# Patient Record
Sex: Female | Born: 1990 | Race: Black or African American | Hispanic: No | Marital: Single | Smoking: Current some day smoker
Health system: Southern US, Community
[De-identification: ages and names within clinical notes are randomized; demographics above are authoritative.]

## PROBLEM LIST (undated history)

## (undated) DIAGNOSIS — F329 Major depressive disorder, single episode, unspecified: Secondary | ICD-10-CM

## (undated) DIAGNOSIS — K259 Gastric ulcer, unspecified as acute or chronic, without hemorrhage or perforation: Secondary | ICD-10-CM

## (undated) DIAGNOSIS — K219 Gastro-esophageal reflux disease without esophagitis: Secondary | ICD-10-CM

## (undated) DIAGNOSIS — D649 Anemia, unspecified: Secondary | ICD-10-CM

## (undated) DIAGNOSIS — R87629 Unspecified abnormal cytological findings in specimens from vagina: Secondary | ICD-10-CM

## (undated) DIAGNOSIS — F419 Anxiety disorder, unspecified: Secondary | ICD-10-CM

## (undated) DIAGNOSIS — F909 Attention-deficit hyperactivity disorder, unspecified type: Secondary | ICD-10-CM

## (undated) DIAGNOSIS — F32A Depression, unspecified: Secondary | ICD-10-CM

## (undated) HISTORY — DX: Unspecified abnormal cytological findings in specimens from vagina: R87.629

## (undated) HISTORY — PX: LEEP: SHX91

## (undated) HISTORY — PX: ELBOW SURGERY: SHX618

## (undated) HISTORY — DX: Depression, unspecified: F32.A

## (undated) HISTORY — PX: TONSILLECTOMY: SUR1361

## (undated) HISTORY — DX: Major depressive disorder, single episode, unspecified: F32.9

## (undated) HISTORY — DX: Gastric ulcer, unspecified as acute or chronic, without hemorrhage or perforation: K25.9

## (undated) HISTORY — DX: Anemia, unspecified: D64.9

## (undated) HISTORY — DX: Attention-deficit hyperactivity disorder, unspecified type: F90.9

---
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
## 2010-09-03 ENCOUNTER — Emergency Department: Payer: Self-pay | Admitting: Emergency Medicine

## 2011-03-18 ENCOUNTER — Ambulatory Visit: Payer: Self-pay | Admitting: Internal Medicine

## 2011-07-08 ENCOUNTER — Ambulatory Visit: Payer: Self-pay | Admitting: Internal Medicine

## 2011-08-01 ENCOUNTER — Inpatient Hospital Stay: Payer: Self-pay | Admitting: Internal Medicine

## 2011-08-01 LAB — CBC
HGB: 4.5 g/dL — CL (ref 12.0–16.0)
MCH: 20.8 pg — ABNORMAL LOW (ref 26.0–34.0)
MCHC: 30.4 g/dL — ABNORMAL LOW (ref 32.0–36.0)
MCV: 68 fL — ABNORMAL LOW (ref 80–100)
WBC: 7.9 10*3/uL (ref 3.6–11.0)

## 2011-08-01 LAB — COMPREHENSIVE METABOLIC PANEL
Albumin: 4.2 g/dL (ref 3.4–5.0)
Anion Gap: 12 (ref 7–16)
BUN: 17 mg/dL (ref 7–18)
Bilirubin,Total: 0.3 mg/dL (ref 0.2–1.0)
Creatinine: 0.77 mg/dL (ref 0.60–1.30)
EGFR (Non-African Amer.): >60
Glucose: 86 mg/dL (ref 65–99)
Potassium: 3.3 mmol/L — ABNORMAL LOW (ref 3.5–5.1)
SGOT(AST): 16 U/L (ref 15–37)
SGPT (ALT): 17 U/L

## 2011-08-01 LAB — PREGNANCY, URINE: Pregnancy Test, Urine: NEGATIVE m[IU]/mL

## 2011-08-01 LAB — URINALYSIS, COMPLETE: Protein: NEGATIVE

## 2011-08-01 LAB — FERRITIN: Ferritin (ARMC): 2 ng/mL — ABNORMAL LOW (ref 8–388)

## 2011-08-02 LAB — BASIC METABOLIC PANEL
Calcium, Total: 8.2 mg/dL — ABNORMAL LOW (ref 8.5–10.1)
Chloride: 111 mmol/L — ABNORMAL HIGH (ref 98–107)
Co2: 20 mmol/L — ABNORMAL LOW (ref 21–32)
Glucose: 71 mg/dL (ref 65–99)
Potassium: 3.1 mmol/L — ABNORMAL LOW (ref 3.5–5.1)
Sodium: 145 mmol/L (ref 136–145)

## 2011-08-02 LAB — CBC WITH DIFFERENTIAL/PLATELET
Basophil #: 0 10*3/uL (ref 0.0–0.1)
HCT: 13.6 % — CL (ref 35.0–47.0)
HGB: 4.1 g/dL — CL (ref 12.0–16.0)
Lymphocyte #: 3.6 10*3/uL (ref 1.0–3.6)
MCHC: 30.1 g/dL — ABNORMAL LOW (ref 32.0–36.0)
Monocyte #: 0.2 10*3/uL (ref 0.0–0.7)
Monocyte %: 3.5 %
Neutrophil #: 3 10*3/uL (ref 1.4–6.5)
Platelet: 378 10*3/uL (ref 150–440)
WBC: 6.9 10*3/uL (ref 3.6–11.0)

## 2011-08-02 LAB — HEMOGLOBIN: HGB: 4.2 g/dL — CL (ref 12.0–16.0)

## 2011-08-02 LAB — IRON AND TIBC: Iron: 6 ug/dL — ABNORMAL LOW (ref 50–170)

## 2011-08-03 LAB — CBC WITH DIFFERENTIAL/PLATELET
Basophil #: 0 10*3/uL (ref 0.0–0.1)
Basophil %: 0.3 %
Eosinophil #: 0 10*3/uL (ref 0.0–0.7)
HCT: 13.5 % — CL (ref 35.0–47.0)
Lymphocyte %: 32 %
MCH: 20.2 pg — ABNORMAL LOW (ref 26.0–34.0)
MCV: 68 fL — ABNORMAL LOW (ref 80–100)
Platelet: 413 10*3/uL (ref 150–440)
RDW: 19.9 % — ABNORMAL HIGH (ref 11.5–14.5)

## 2011-08-03 LAB — MAGNESIUM: Magnesium: 1.9 mg/dL

## 2011-08-03 LAB — FOLATE: Folic Acid: 13.5 ng/mL (ref 3.1–100.0)

## 2011-08-03 LAB — POTASSIUM: Potassium: 3.8 mmol/L (ref 3.5–5.1)

## 2011-08-04 LAB — URINALYSIS, COMPLETE
Bacteria: NONE SEEN
Bilirubin,UR: NEGATIVE
Blood: NEGATIVE
Glucose,UR: NEGATIVE mg/dL (ref 0–75)
Ketone: NEGATIVE
Leukocyte Esterase: NEGATIVE
Nitrite: NEGATIVE
RBC,UR: <1 /HPF (ref 0–5)
Specific Gravity: 1.008 (ref 1.003–1.030)
Squamous Epithelial: 1
WBC UR: 1 /HPF (ref 0–5)

## 2011-08-04 LAB — HEMOGLOBIN: HGB: 4.9 g/dL — CL (ref 12.0–16.0)

## 2011-08-08 ENCOUNTER — Ambulatory Visit: Payer: Self-pay | Admitting: Internal Medicine

## 2013-03-06 DIAGNOSIS — D069 Carcinoma in situ of cervix, unspecified: Secondary | ICD-10-CM | POA: Insufficient documentation

## 2014-07-08 ENCOUNTER — Emergency Department: Payer: Self-pay | Admitting: Student

## 2014-08-31 NOTE — Consult Note (Signed)
PATIENT NAME:  Sherri [name], Sherri [name] MR#:  [id number] DATE OF BIRTH:  October 10, 1990  DATE OF CONSULTATION:  08/03/2011  REFERRING PHYSICIAN:  Katharina Caper, MD  CONSULTING PHYSICIAN:  Lurline Del, MD  PRIMARY CARE PHYSICIAN: Angus Palms, MD  REASON FOR CONSULTATION: Severe anemia and melena.   HISTORY OF PRESENT ILLNESS: This is a 24 year old African American female with history of stomach ulcer with bleeding about three years ago, also history of attention deficit/hyperactivity disorder, depression, and bipolar disorder. The patient was admitted to the hospital the day before yesterday with nausea and vomiting. Apparently she has been having trouble with nausea and vomiting for almost a month. Recently she has noticed the vomitus to be dark and coffee-ground in nature. She is also complaining of some burning upper abdominal pain. Apparently she had an EGD about 2 or 3 years ago where peptic ulcer disease was found. She is also noticing somewhat dark stool for the last couple of days. She denies any bright red blood per rectum. Her hemoglobin was noted to be 4.5. Her MCV is low at around 68. The patient apparently has been having some trouble with heavy menstrual periods as well that started two days ago. The patient is a Scientist, product/process development. Transfusion was recommended but has been refused by the patient. Her hemoglobin is now 4.1 and this now seems to be stable at 4.1 for about 24 hours or so.   PAST MEDICAL HISTORY: As above.   HOME MEDICATIONS: Remeron, Excedrin, and Intuniv.   PAST SURGICAL HISTORY:  1. Tonsillectomy. 2. EGD as mentioned above.   ALLERGIES: Penicillin.   FAMILY HISTORY: Positive for hypertension and diabetes.   SOCIAL HISTORY: She is single. She smokes two cigarettes a day. She drinks alcohol only occasionally.   REVIEW OF SYSTEMS: Positive for fatigue, weakness, chills, and about 25 pound weight loss, as well as dark stools and coffee-ground emesis. The patient is also  complaining of some left upper quadrant pain. She denies significant chest pain or shortness of breath on rest. The patient is complaining of a heavy menstrual period that started a few days ago.   PHYSICAL EXAMINATION:   GENERAL: Fairly well built female who does not appear to be in any acute distress. She appears to be hemodynamically fairly stable.  VITALS:  Her heart rate is in the 70s and 80s and blood pressure is about 110/50. Clinically she does appear to be severely anemic.   NECK: Neck veins are flat.   LUNGS: Clear to auscultation.   HEART: Regular rate and rhythm. No gallops or murmur.   ABDOMEN: Quite soft and benign. Bowel sounds are positive and regular. No guarding or rebound was noted. No hepatosplenomegaly or ascites.   NEUROLOGIC: She is fully awake, alert, and oriented. Neurological examination appears to be unremarkable.  LABS/STUDIES: Her white cell count is 7.8, hemoglobin is 4.1, hematocrit 13.5, platelet count of 413, and MCV 68. Potassium is somewhat low at 3.1. Iron saturation is only 1%. BUN 17 and creatinine 0.77. Liver enzymes are normal. Pregnancy test is negative.   Urinalysis is unremarkable.   Ultrasound of the pelvis is quite unremarkable.   ASSESSMENT AND PLAN: The patient is with what appears to be chronic anemia suggested by very low iron saturation and very low MCV. There may have been a recent drop in her hemoglobin and hematocrit secondary to what appears to be upper GI bleed, as reported by the patient. Since she has been in the hospital, there have been no  episodes of melena or coffee-ground emesis and her hemoglobin and hematocrit, even though very low, seems to be fairly stable at around 4.1. In 2010 her hemoglobin was reported to be 11.4. We do not have any other hemoglobin or hematocrit measurements. The patient has been recommended blood transfusion followed by an upper GI endoscopy. 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 I have had a detailed discussion with her regarding the risk of performing an upper GI endoscopy with a hemoglobin of 4.1. Any further bleeding can cause significant harm to her because of her very low baseline hemoglobin. The patient and her mother completely understand the grave consequences associated with not accepting blood transfusion, especially since there are concerns about recent upper GI bleed. Despite their understanding, the patient continues to refuse to accept any blood transfusion. The patient has been started on IV proton pump inhibitor. I will discuss with the anesthesiologist regarding the safety of performing an upper GI endoscopy in a severely anemic individual. I believe that an endoscopy with sedation is not going to be without significant risk to her and in case a bleeding ulcer is found which requires therapeutic endoscopy that may induce further bleeding and further drop in her hemoglobin and hematocrit which could then result in catastrophic consequences. After discussion with the anesthesiologist, we will make further recommendations, although I would continue to recommend blood transfusion because that would be the most appropriate way to take care of this rather complicated situation. Meanwhile we will continue her on a clear liquid diet as well as IV proton pump inhibitor. As mentioned above, there are no signs of active gastrointestinal blood loss and we will return with further recommendations.  ____________________________ Lurline DelShaukat Monea Pesantez, MD si:slb D: 08/03/2011 09:05:07 ET T: 08/03/2011 10:17:50 ET JOB#: [id number]  cc: Lurline DelShaukat Jebediah Macrae, MD, <Dictator> Lurline DelSHAUKAT Turhan Chill MD ELECTRONICALLY SIGNED 08/10/2011 9:56

## 2014-08-31 NOTE — Consult Note (Signed)
Brief Consult Note: Diagnosis: GI bleed and anemia.   Patient was seen by consultant.   Orders entered.   Comments: Probable UGI bleed with severe anemia. The care is complicated by the fact that patient is refusing blood transfusion and anesthesia and endoscopy, especially therapeutic EGD may not be possible with this low hemoglobin without significant risk to her. This has been discussed with her and her mother but the patient will not accept blood transfusion. I will discuss the case with anesthesia as well. No signs of active bleeding today. Will start clear liquids and make her NPO after midnight. Further recommendations after discussion with anesthesia.  Electronic Signatures: Lurline DelIftikhar, Sophi Calligan (MD)  (Signed 26-Mar-13 14:20)  Authored: Brief Consult Note   Last Updated: 26-Mar-13 14:20 by Lurline DelIftikhar, Karel Mowers (MD)

## 2014-08-31 NOTE — Consult Note (Signed)
Chief Complaint:   Subjective/Chief Complaint Feels well. No complaints. Hemoglobin better at 4.9.  Impression: Clean based duodenal ulcer. No signs of active bleeding. H and H stable.  Recommendations: Agree with discharge on PPI. Avoid NSAID's. Will sign off. Thanks.   Electronic Signatures: Lurline DelIftikhar, Gadge Hermiz (MD)  (Signed 28-Mar-13 19:06)  Authored: Chief Complaint   Last Updated: 28-Mar-13 19:06 by Lurline DelIftikhar, Edwyna Dangerfield (MD)

## 2014-08-31 NOTE — Discharge Summary (Signed)
PATIENT NAME:  Sherri [name], Sherri [name] A MR#:  [id number] DATE OF BIRTH:  [birth year]  DATE OF ADMISSION:  08/01/2011 DATE OF DISCHARGE:  08/04/2011  ADMITTING DIAGNOSIS: Severe acute anemia.   DISCHARGE DIAGNOSES:  1. Acute on chronic GI bleed. 2. Status post EGD by Dr. Niel Hummer on 08/03/2011 which showed gastritis as well as small nonbleeding duodenal ulcer.  3. Gastrointestinal bleed, menstrual bleed. 4. Hypokalemia. 5. Hypomagnesemia. 6. Hypotension, transient. 7. History of  depression.  8. History of attention deficit/hyperactivity disorder.  9. Jehovah's Witness.  10. Episode of urinary incontinence as well as diarrhea on the 28th of 07/2011, resolved.   DISCHARGE CONDITION: Stable.   DISCHARGE MEDICATIONS: The patient is to resume her outpatient medications which are:  1. Intuniv 2 mg once daily.  2. Remeron 30 mg p.o. at bedtime.  3. Iron sulfate 330 mg twice daily. 4. Omeprazole 40 mg p.o. twice daily.   DIET: Soft. Advance to regular over the next one week.   PHYSICAL ACTIVITY LIMITATIONS: As tolerated.    FOLLOWUP:  1. Follow-up appointment with the [facility] on 08/16/2011 at 7:15 p.m.  2. Follow up with Dr. Tiburcio Pea of GYN in the next one week after discharge.  Outpatient followup with Dr. Tiburcio Pea should be arranged by the [facility] because of late discharge.   CONSULTANTS:  Dr. Niel Hummer.   RADIOLOGIC STUDIES: Ultrasound of the pelvis 26th of 07/2011, revealed unremarkable transabdominal pelvic ultrasound.  HISTORY OF PRESENT ILLNESS:  The patient is a 24 year old African American female with past medical history significant for history of peptic ulcer disease who presented to the hospital with complaints of nausea, vomiting, and hematemesis.  Please refer to Dr. Arlys John admission note on the 25th of 07/2011. Apparently she started having nausea and vomiting two days prior to coming to the hospital and on the day of admission the patient's vomitus became dark,  bloody, coffee-ground emesis. She was also complaining of abdominal pain, mostly in her left upper quadrant, a burning sensation kind of pain. On exam she also had discomfort in the epigastric area. She was very dizzy and short of breath and got tired quickly. Her lab data showed a hemoglobin level of 4.5. She was admitted to the hospital. It was felt that the patient's anemia was very likely acute posthemorrhagic anemia as well as possibly  acute on chronic iron deficiency anemia. The patient's iron studies were performed and this showed a very low ferritin level of two.  The patient's iron saturation was only 1. Potassium level was found to be 3.3, otherwise unremarkable basic metabolic panel. The patient's liver enzymes were unremarkable. The patient's CBC as mentioned above was remarkable for hemoglobin of 4.5, hematocrit of 14.9, and platelet count 428 with very low MCV of 68.    HOSPITAL COURSE: The patient was admitted to the hospital. Consultation with Dr. Niel Hummer was obtained.  1. Acute gastrointestinal bleed: The patient was admitted to the hospital. She was rehydrated and her hemoglobin was checked every few hours. She underwent an upper endoscopy which revealed gastritis as well as a small, nonbleeding duodenal ulcer. She was started on a clear liquid diet after EGD. She did progressively better and did not have any problems on discharge. She was also managed on a proton pump inhibitor, Protonix IV drip. 2. Iron deficiency anemia:  She was continued on Venofer daily. As she had menstrual period, Depo-Provera was also given on the day of admission and her periods tapered off. On the day of  discharge she felt satisfactory.  Her hemoglobin was noted to be improving with a hemoglobin level of 4.9 on the day of discharge, 08/04/2011. The patient is to continue iron supplements and follow up with the [facility]  as an outpatient. 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 It is recommended to follow the patient's hemoglobin levels as an outpatient very closely and very likely will initiate her on birth control pills to decrease blood loss. The patient underwent ultrasound of her pelvic area, which was unremarkable. She was consulted by numerous physicians, as mentioned above Dr. Niel HummerIftikhar, who took her to the operating room for  EGD, but also Dr. Annamarie MajorPaul Harris of GYN, who felt that the patient needs to be tested for von Willebrand's disease, as according to him the most common cause of hemoglobinopathy in young people could be this problem. He [facility] for ongoing management. He also felt that the patient would benefit from Depo-Provera every three months or change to birth control pills  to control her periods. The patient was also consulted by Dr. Sherrlyn HockPandit, who agreed that the patient would benefit from iron IV. However, he felt that Epogen would not be appropriate in this situation because her iron stores were not adequate.  Vitamin B12 and folic acid levels were checked and those were completely within normal limits.  In regard to Von Willebrand's factor,  unfortunately those studies are not back yet at the time of dictation. Specimen was received at the performing department on 08/02/2011.  3. Depression: The patient is to continue Remeron. 4. Attention deficit/hyperactivity disorder: The patient is to continue Intuniv.  5. The patient was noted to be hypokalemic as well as hypomagnesemic. Those microelements were supplemented for her while she was in the hospital.  6. She had a short episode of hypotension on the day of discharge, 08/04/2011.  However it was briefly lived.  Blood pressure had gone down to 91/53.  However, approximately half an hour later the patient's blood pressure was much improved. On the day of discharge, the patient's vital signs were stable with  temperature 98.1, pulse 98, respiration rate 20, blood pressure 117/66, saturation 100% on room air at rest.  7. The patient was also complaining of an episode of urinary incontinence: Urinalysis was repeated. However, there was no sign of pyuria, which would be suggestive of urinary tract infection. No further recommendations were made in regards to that.  The patient is being discharged in stable condition with the above-mentioned medications and followup.   TIME SPENT: 40 minutes. ____________________________ Katharina Caperima Jaquita Bessire, MD rv:bjt D:  08/04/2011 20:24:47 ET         T: 08/07/2011 13:56:30 ET        JOB#: [id number]   cc: [facility] Rodolphe Edmonston MD ELECTRONICALLY SIGNED 08/10/2011 14:05

## 2014-08-31 NOTE — H&P (Signed)
PATIENT NAME:  Sherri [name], Sherri [name] A MR#:  [id number] DATE OF BIRTH:  [birth year]  DATE OF ADMISSION:  08/01/2011  PRIMARY CARE PHYSICIAN: Angus Palms, MD  HISTORY OF PRESENT ILLNESS: The patient is a 24 year old African American female with past medical history significant for history of stomach ulcer in the past, history of GI bleed in the past, approximately three years ago, history of depression, ADHD, and bipolar disorder who presented to the hospital with complaints of nausea and vomiting. According to the patient, she has been having problems with nausea for approximately a month now. She has however started having vomiting two days ago, on Saturday. She vomited twice today as well as twice on Saturday. On Saturday as well as today she had dark blood in her vomitus, coffee ground emesis. She has been also complaining of abdominal pain mostly in the left upper quadrant, burning kind of pain especially whenever she would eat greasy food. However, she denies any constant pain and denies any significant exacerbation of that discomfort or pain and burning sensation with food. She was diagnosed with peptic ulcer disease in her stomach two to three years ago. She had an EGD done in [place] and was followed by [facility] physicians. She has been also noticing black stool since Saturday. She has been losing weight. She told me that she has lost approximately 25 pounds, in the last couple of weeks. She has also noted chills. She admitted of having problems with lightheadedness, dizziness, and feeling presyncopal, especially whenever she stands up. Because of that discomfort, she presented to the emergency room for further evaluation. In the emergency room, she was noted to have a hemoglobin level of 4.5. 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   PAST MEDICAL HISTORY:  1. History of peptic ulcer disease. 2. Gastrointestinal bleed in the  past. 3. History of depression. 4. Attention deficit/hyperactivity disorder. 5. Bipolar disorder. 6. Left-sided headaches for which she usually takes Excedrin.   MEDICATIONS:  1. Intuniv 2 mg p.o. at bedtime. 2. Remeron 30 mg p.o. at bedtime. 3. Excedrin two caplets up to three times daily for headache.  PAST SURGICAL HISTORY: 1. Tonsillectomy. 2. Left arm surgery for displaced left distal radius as well as ulna fracture, in 2001 by Dr. Deeann Saint.   ALLERGIES: Penicillin - which gives her itching.   FAMILY HISTORY: Hypertension as well as diabetes in the patient's mother. The patient's father died at age of 93 of myocardial infarction.   SOCIAL HISTORY: The patient is single, has no children, used to smoke a little bit, a few cigarettes; however, she quit three weeks ago. She drinks alcohol occasionally socially. She works for [employer], as well as [employer].   REVIEW OF SYSTEMS: Positive for feeling chills, fatigue and weak, pains in her abdomen, and weight loss of 25 pounds since a couple of weeks ago. She feels lightheaded and dizzy as well as presyncopal whenever she stands up. She has also some blurring of vision for which she uses glasses. She uses glasses for nearsightedness. She also has year-around allergies as well as sinus congestion. She gets very tired whenever she walks around and she will get really short of breath. She admits of having chest pain here in the emergency room when she was told about her low hemoglobin levels. Admits of nausea and vomiting for a while now as well as loose stools over the past few days, black looking stools as  well as brown vomitus. Abdominal pain and hematemesis as well as rectal bleeding. CONSTITUTIONAL: She denies any fevers or weight gain. EYES: Denies any double vision, glaucoma, or cataracts. ENT: Denies any tinnitus, allergies, epistaxis, sinus pain, dentures, or difficulty swallowing. RESPIRATORY: Denies any cough,  wheeze, asthma, or chronic obstructive pulmonary disease. CARDIOVASCULAR: Denies any orthopnea, edema, arrhythmias, palpitations, or syncope. GASTROINTESTINAL: Denies any constipation or hemorrhoids. GENITOURINARY: No dysuria, hematuria, frequency, or incontinence. ENDOCRINOLOGY: Denies any polydipsia, nocturia, thyroid problems, heat or cold intolerance, or thirst. HEMATOLOGIC: Denies anemia, easy bruising, bleeding, or swollen glands. SKIN: Denies any acne, rashes, lesions, or change in moles. MUSCULOSKELETAL: Denies arthritis, cramps, swelling, or gout. NEUROLOGIC: No numbness, epilepsy, or tremor. PSYCHIATRIC: No anxiety, insomnia, or depression. GYN: She started her menstrual period Saturday night, which was two days ago. She usually changes 5 or 6 pads a day.    PHYSICAL EXAMINATION:   VITALS: On arrival to the emergency room, her temperature was 98.3, pulse 105, respirations 18, blood pressure 133/66, and saturation 99% on room air.   GENERAL: This is a well developed, well nourished [place] American female in no significant distress comfortable on the stretcher.   HEENT: Her pupils are equal and reactive to light. Extraocular movements are intact. No icterus or conjunctivitis. Has normal hearing. No pharyngeal erythema. Mucosa is moist.   NECK: No masses, supple and nontender. Thyroid is not enlarged. No adenopathy. No JVD or carotid bruits bilaterally. Full range of motion.   LUNGS: Clear to auscultation in all fields. No rales or rhonchi or crepitations. No labored inspirations, increased effort, dullness to percussion, or overt respiratory distress.   CARDIOVASCULAR: S1 and S2 appreciated. No murmurs, gallops, or rubs noted. PMI not lateralized. Chest is nontender to palpation.   EXTREMITIES: 1+ pedal pulses. No lower extremity edema, calf tenderness, or cyanosis.   ABDOMEN: Soft, tender in the epigastric area as well as right upper quadrant. No rebound or guarding was noted. No  significant discomfort in the left upper quadrant. No hepatosplenomegaly. Bowel sounds are present.   RECTAL: Deferred.   MUSCULOSKELETAL: Able to move all extremities. No cyanosis, degenerative joint disease, or kyphosis. Gait is not tested.   SKIN: Skin did not reveal any rashes, lesions, erythema, nodularity, or induration. It was warm and dry to palpation.   LYMPH: No adenopathy in the cervical region.   NEUROLOGICAL: Cranial nerves grossly intact. Sensory is intact. No dysarthria or aphasia. The patient is alert and oriented to time, person, and place. Cooperative. Memory is good.   PSYCHIATRIC: No significant confusion, agitation, or depression noted.   LABS/STUDIES: BMP showed potassium of 3.3. Magnesium 1.7. Iron binding capacity was 492 and ferritin level was low at 2. Liver enzymes are normal. CBC: White blood cell count 7.9, hemoglobin 4.5, platelets 428, and MCV low at 68.   Urinalysis: Straw hazy urine. Negative for glucose, bilirubin or ketones. Specific gravity 1.020, pH 5.0, 2+ blood, negative for protein, nitrites, or leukocyte esterase, less than 1 red blood cell, 4 white blood cells, trace bacteria,  and 2 epithelial cells as well as mucus was present.   Pregnancy test in urine was negative.   Radiologic studies and EKG were not done.   ASSESSMENT AND PLAN:  1. Acute posthemorrhagic anemia as well as likely chronic iron deficiency anemia: Admit the patient to the medical floor. Unfortunately, we will not be able to transfuse her. We will get hemoglobin levels every eight hours. We will also offer her Venofer IV today as  well as daily for five days. I will also give her one dose of Depo-Provera 150 mg IM to taper off her menstrual bleeding.  2. Gastrointestinal bleed: We will continue PPI drip. We will ask gastroenterologist to see the patient. I discussed with this patient quite extensively her use of nonsteroidal anti-inflammatory medications and suggested that she would  need to talk to her primary care physician to evaluate her possible migraine headaches and initiate her on migraine headache medications as needed.  3. Depression: Continue Remeron.  4. Attention deficit/hyperactivity disorder: Continue Intuniv.   TIME SPENT: One hour.  ____________________________ Katharina Caperima Para Cossey, MD rv:slb D: 08/01/2011 23:25:03 ET     T: 08/02/2011 09:37:12 ET        JOB#: [id number] cc: Katharina Caperima Thadd Apuzzo, MD, <Dictator> Angus PalmsSionne George, MD Yuliya Nova MD ELECTRONICALLY SIGNED 08/10/2011 14:03

## 2014-08-31 NOTE — Consult Note (Signed)
PATIENT NAME:  Sherri Sherri [name], Sherri Sherri [name] MR#:  [id number] DATE OF BIRTH:  [birth year]  DATE OF CONSULTATION:  08/02/2011  REFERRING PHYSICIAN:  Dr. Lafayette DragonFirozvi CONSULTING PHYSICIAN:  Durga Saldarriaga R. Sherri HockPandit, MD  REASON FOR CONSULTATION: Patient with blood loss anemia, is Sherri [name] Jehovah's witness,  question need for Procrit.   HISTORY OF PRESENT ILLNESS: The patient is Sherri [name] 24 year old African American female who was admitted to the hospital yesterday with complaints of nausea of one month's duration, vomiting two days prior to admission, funny kind of upper abdominal pain, and coffee-ground emesis. The patient reportedly has past medical history significant for history of depression, attention deficit/hyperactivity disorder, bipolar disorder, GI bleed with history of stomach ulcer in the past about three years ago and [facility], has had EGD done there in the past. The patient also has noticed some black stools. She has had weight loss due to the above symptoms preventing her from eating. She feels lightheaded and dizzy on getting up and trying to ambulate. Upon presentation to the ER she was found to have severe anemia with hemoglobin of 4.5, hematocrit 14.9, platelets 428, WBC 7900, MCV low at 68. Creatinine was 0.77. Liver functions unremarkable. Anemia work-up so far shows severe iron deficiency with ferritin of 2, serum iron of 6, iron saturation of only 1%, TIBC elevated at 492. The patient has already been started on IV iron sucrose (Venofer) yesterday, planned for 200 mg IV daily times five days.   PAST MEDICAL HISTORY/ PAST SURGICAL HISTORY: As in history of present illness above. In addition: 1. History of left-sided headaches and usually takes Excedrin.  2. Tonsillectomy.  3. Left arm surgery for displaced left radius and ulna fracture 2001.   FAMILY HISTORY: Remarkable for diabetes, hypertension, and heart disease. Denies hematological disorders.   SOCIAL HISTORY: Denies any smoking  history, occasional alcohol intake. Denies recreational drug usage.   ALLERGIES: Penicillin.   HOME MEDICATIONS:  1. Remeron 30 mg p.o. at bedtime.  2. Excedrin two caplets 3 times daily for headaches p.r.n.  3. Intuniv 2 mg p.o. at bedtime.   REVIEW OF SYSTEMS:  CONSTITUTIONAL: As in history of present illness. No fever or chills. No night sweats. HEENT: Has frequent headaches, dizziness on getting up and ambulating. No epistaxis, ear or jaw pain. CARDIAC: Denies any angina, palpitations, orthopnea, or PND. LUNGS: Mild dyspnea on activity. Otherwise, no new cough, hemoptysis, or chest pain. GI: As in history of present illness. GU: No dysuria or hematuria. SKIN: No new rashes or pruritus. HEMATOLOGIC: As in history of present illness. MUSCULOSKELETAL: No new bone pains or joint pains or swelling. NEUROLOGIC: No new focal weakness, loss of consciousness, or seizures. No paresthesias in extremities. ENDOCRINE: No polyuria or polydipsia. Has lost some weight recently.   PHYSICAL EXAMINATION:  GENERAL: The patient is tired-looking, resting in bed, otherwise alert and oriented and converses appropriately. No icterus. Significant pallor present.   VITAL SIGNS: Temperature 98.5, pulse 82, respirations 18, blood pressure 106/63, 100% on room air.   HEENT: Normocephalic, atraumatic. Extraocular movements intact. Sclerae anicteric. No oral thrush.   NECK:  Supple without lymphadenopathy.   CARDIOVASCULAR: S1, S2, regular rate and rhythm.   LUNGS: Lungs show bilateral good air entry.   ABDOMEN: Soft, tenderness in epigastric area. No distention. No hepatosplenomegaly.  Bowel sounds present.   EXTREMITIES: No edema or cyanosis.   MUSCULOSKELETAL: No obvious joint deformity or swelling.   SKIN: No major bruising or rashes.   NEUROLOGIC: Cranial nerves  intact. Moves all extremities spontaneously.   LABORATORY, DIAGNOSTIC, AND RADIOLOGICAL DATA: As in history of present illness above.    IMPRESSION AND RECOMMENDATION: 24 year old female patient with known history of gastric ulcer in the past along with GI bleed, history of frequent Excedrin usage, presents with upper abdominal pain, nausea and occasional vomiting, coffee-ground emesis, and severe iron deficiency anemia. The patient is being evaluated by GI, Dr. Niel Hummer, to plan endoscopic evaluation. The patient is Sherri [name] TEFL teacher Witness and will not accept red blood cell transfusions. Dr. Lafayette Dragon has already ordered IV Venofer starting yesterday. Agree with parenteral iron therapy given severe iron deficiency and that we need to try and improve anemia as quickly as possible. Hematology has been consulted for opinion regarding ESA therapy like Procrit. Given obvious evidence of severe iron deficiency, Procrit would not be indicated in this situation. The patient otherwise does not have any leukopenia or thrombocytopenia. Hospitalist also has already sent off factor VIII activity along with von Willebrand factor antigen and activity level to rule out these bleeding disorders. Will also check B12 and folate levels to make sure she does not have occult deficiency of these essential nutrients. No other recommendations at this time. The patient and her mother present were explained above, agreeable to this plan.   Thank you for the referral. Please feel please feel free to contact me if any additional questions.     ____________________________ Maren Reamer Sherri Hock, MD srp:[id number] D: 08/02/2011 23:44:32 ET T: 08/03/2011 07:00:49 ET JOB#: [id number]  cc: Darryll Capers R. Sherri Hock, MD, <Dictator> Wille Celeste MD ELECTRONICALLY SIGNED 08/04/2011 11:32

## 2015-05-14 DIAGNOSIS — E663 Overweight: Secondary | ICD-10-CM | POA: Insufficient documentation

## 2015-06-15 ENCOUNTER — Encounter: Payer: Self-pay | Admitting: Emergency Medicine

## 2015-06-15 ENCOUNTER — Emergency Department
Admission: EM | Admit: 2015-06-15 | Discharge: 2015-06-15 | Disposition: A | Discharge status: Home / Self Care | Payer: Self-pay | Attending: Emergency Medicine | Admitting: Emergency Medicine

## 2015-06-15 DIAGNOSIS — Z3202 Encounter for pregnancy test, result negative: Secondary | ICD-10-CM | POA: Insufficient documentation

## 2015-06-15 DIAGNOSIS — N390 Urinary tract infection, site not specified: Secondary | ICD-10-CM | POA: Insufficient documentation

## 2015-06-15 DIAGNOSIS — Z880 Allergy status to penicillin: Secondary | ICD-10-CM | POA: Insufficient documentation

## 2015-06-15 LAB — BASIC METABOLIC PANEL
Anion gap: 9 (ref 5–15)
BUN: 14 mg/dL (ref 6–20)
CHLORIDE: 109 mmol/L (ref 101–111)
CO2: 22 mmol/L (ref 22–32)
CREATININE: 0.5 mg/dL (ref 0.44–1.00)
Calcium: 9.2 mg/dL (ref 8.9–10.3)
GFR calc Af Amer: >60 mL/min (ref >60)
GFR calc non Af Amer: >60 mL/min (ref >60)
GLUCOSE: 91 mg/dL (ref 65–99)
POTASSIUM: 4.1 mmol/L (ref 3.5–5.1)
SODIUM: 140 mmol/L (ref 135–145)

## 2015-06-15 LAB — URINALYSIS COMPLETE WITH MICROSCOPIC (ARMC ONLY)
Bilirubin Urine: NEGATIVE
Glucose, UA: NEGATIVE mg/dL
Ketones, ur: NEGATIVE mg/dL
Nitrite: NEGATIVE
PH: 6 (ref 5.0–8.0)
PROTEIN: 30 mg/dL — AB
Specific Gravity, Urine: 1.018 (ref 1.005–1.030)
Trans Epithel, UA: 3

## 2015-06-15 LAB — CBC WITH DIFFERENTIAL/PLATELET
BASOS ABS: 0 10*3/uL (ref 0–0.1)
BASOS PCT: 0 %
Eosinophils Absolute: 0.1 10*3/uL (ref 0–0.7)
Eosinophils Relative: 1 %
HEMATOCRIT: 35.2 % (ref 35.0–47.0)
HEMOGLOBIN: 11.7 g/dL — AB (ref 12.0–16.0)
Lymphocytes Relative: 36 %
Lymphs Abs: 2.7 10*3/uL (ref 1.0–3.6)
MCH: 29.5 pg (ref 26.0–34.0)
MCHC: 33.2 g/dL (ref 32.0–36.0)
MCV: 89.1 fL (ref 80.0–100.0)
MONO ABS: 0.6 10*3/uL (ref 0.2–0.9)
Monocytes Relative: 8 %
NEUTROS ABS: 4.1 10*3/uL (ref 1.4–6.5)
NEUTROS PCT: 55 %
Platelets: 223 10*3/uL (ref 150–440)
RBC: 3.95 MIL/uL (ref 3.80–5.20)
RDW: 13.8 % (ref 11.5–14.5)
WBC: 7.4 10*3/uL (ref 3.6–11.0)

## 2015-06-15 LAB — POCT PREGNANCY, URINE: PREG TEST UR: NEGATIVE

## 2015-06-15 MED ORDER — CIPROFLOXACIN HCL 500 MG PO TABS: 500.0000 mg | ORAL_TABLET | Freq: Two times a day (BID) | ORAL | Status: DC

## 2015-06-15 NOTE — Discharge Instructions (Signed)

## 2015-06-15 NOTE — ED Notes (Signed)
States she developed some pelvic pain and urinary freq on sat

## 2015-06-15 NOTE — ED Provider Notes (Signed)
[facility] Emergency Department Provider Note  ____________________________________________  Time seen: On arrival  I have reviewed the triage vital signs and the nursing notes.   HISTORY  Chief Complaint Pelvic Pain    HPI Sherri [name] is a 25 y.o. female who presents with complaints of urinary frequency and mild dysuria for 2 days. She did notice a small of blood in her urine yesterday. She denies back pain. No fevers or chills. She denies vaginal discharge. She has a history of urinary tract infection which felt similar.    History reviewed. No pertinent past medical history.  There are no active problems to display for this patient.   History reviewed. No pertinent past surgical history.  Current Outpatient Rx  Name  Route  Sig  Dispense  Refill  . ciprofloxacin (CIPRO) 500 MG tablet   Oral   Take 1 tablet (500 mg total) by mouth 2 (two) times daily.   14 tablet   0     Allergies Amoxicillin and Penicillins  No family history on file.  Social History Social History  Substance Use Topics  . Smoking status: Never Smoker   . Smokeless tobacco: None  . Alcohol Use: No    Review of Systems  Constitutional: Negative for fever.     Genitourinary: As above Musculoskeletal: Negative for back pain. Skin: Negative for rash. Neurological: Negative for headaches    ____________________________________________   PHYSICAL EXAM:  VITAL SIGNS: ED Triage Vitals  Enc Vitals Group     BP 06/15/15 1154 148/81 mmHg     Pulse Rate 06/15/15 1154 70     Resp 06/15/15 1154 18     Temp 06/15/15 1154 98.7 F (37.1 C)     Temp Source 06/15/15 1154 Oral     SpO2 06/15/15 1154 98 %     Weight 06/15/15 1154 165 lb (74.844 kg)     Height 06/15/15 1154  (1.651 m)     Head Cir --      Peak Flow --      Pain Score 06/15/15 1156 6     Pain Loc --      Pain Edu? --      Excl. in GC? --      Constitutional: Alert and oriented.  Well appearing and in no distress. Eyes: Conjunctivae are normal.  ENT   Head: Normocephalic and atraumatic.   Mouth/Throat: Mucous membranes are moist. Cardiovascular: Normal rate, regular rhythm.  Respiratory: Normal respiratory effort without tachypnea nor retractions.  Gastrointestinal: Mild tenderness to palpation suprapubically. No distention. There is no CVA tenderness. Musculoskeletal: Nontender with normal range of motion in all extremities. Neurologic:  Normal speech and language. No gross focal neurologic deficits are appreciated. Skin:  Skin is warm, dry and intact. No rash noted. Psychiatric: Mood and affect are normal. Patient exhibits appropriate insight and judgment.  ____________________________________________    LABS (pertinent positives/negatives)  Labs Reviewed  URINALYSIS COMPLETEWITH MICROSCOPIC ([facility] ONLY) - Abnormal; Notable for the following:    Color, Urine YELLOW (*)    APPearance CLOUDY (*)    Hgb urine dipstick 2+ (*)    Protein, ur 30 (*)    Leukocytes, UA 3+ (*)    Bacteria, UA RARE (*)    Squamous Epithelial / LPF 0-5 (*)    All other components within normal limits  CBC WITH DIFFERENTIAL/PLATELET - Abnormal; Notable for the following:    Hemoglobin 11.7 (*)    All other components within normal  limits  BASIC METABOLIC PANEL  POCT PREGNANCY, URINE  POC URINE PREG, ED    ____________________________________________     ____________________________________________    RADIOLOGY I have personally reviewed any xrays that were ordered on this patient: None  ____________________________________________   PROCEDURES  Procedure(s) performed: none   ____________________________________________   INITIAL IMPRESSION / ASSESSMENT AND PLAN / ED COURSE  Pertinent labs & imaging results that were available during my care of the patient were reviewed by me and considered in my medical decision making (see chart for  details).  Patient well-appearing in no distress, her urine is consistent with urinary tract infection. Her lab work is otherwise reassuring. Her vitals are normal. She is nontoxic we will treat with by mouth antibiotics with outpatient follow-up as needed. Return precautions discussed  ____________________________________________   FINAL CLINICAL IMPRESSION(S) / ED DIAGNOSES  Final diagnoses:  UTI (lower urinary tract infection)     Sherri Every, MD 06/15/15 1459

## 2015-06-15 NOTE — ED Notes (Signed)
Having urinary freq and dysuria for couple of days  Noticed some blood in urine yesterday but not today   Conts to have some pelvic pressure with urination

## 2015-09-30 ENCOUNTER — Telehealth: Payer: Self-pay | Admitting: General Practice

## 2015-09-30 NOTE — Telephone Encounter (Signed)
Opened in error

## 2015-10-30 ENCOUNTER — Emergency Department
Admission: EM | Admit: 2015-10-30 | Discharge: 2015-10-30 | Disposition: A | Discharge status: Home / Self Care | Payer: Managed Care, Other (non HMO) | Attending: Emergency Medicine | Admitting: Emergency Medicine

## 2015-10-30 ENCOUNTER — Encounter: Payer: Self-pay | Admitting: Emergency Medicine

## 2015-10-30 DIAGNOSIS — H00013 Hordeolum externum right eye, unspecified eyelid: Secondary | ICD-10-CM

## 2015-10-30 DIAGNOSIS — H579 Unspecified disorder of eye and adnexa: Secondary | ICD-10-CM | POA: Diagnosis present

## 2015-10-30 DIAGNOSIS — H00012 Hordeolum externum right lower eyelid: Secondary | ICD-10-CM | POA: Insufficient documentation

## 2015-10-30 MED ORDER — TETRACAINE HCL 0.5 % OP SOLN
2.0000 [drp] | Freq: Once | OPHTHALMIC | Status: AC
Start: 2015-10-30 — End: 2015-10-30
  Administered 2015-10-30: 2 [drp] via OPHTHALMIC
  Filled 2015-10-30: qty 2

## 2015-10-30 MED ORDER — ERYTHROMYCIN 5 MG/GM OP OINT: TOPICAL_OINTMENT | Freq: Three times a day (TID) | OPHTHALMIC | Status: AC

## 2015-10-30 MED ORDER — ERYTHROMYCIN 5 MG/GM OP OINT
TOPICAL_OINTMENT | Freq: Once | OPHTHALMIC | Status: AC
  Administered 2015-10-30: 1 via OPHTHALMIC
  Filled 2015-10-30: qty 1

## 2015-10-30 NOTE — ED Provider Notes (Signed)
[facility] Emergency Department Provider Note  ____________________________________________  Time seen:   I have reviewed the triage vital signs and the nursing notes.   HISTORY  Chief Complaint Eye Problem    HPI Sherri [name] is a 25 y.o. female resents with acute onset of right lower eyelid and itching upon awakening this morning. Patient stated that when she awoke her I lateral shows were matted together. Patient denies any visual changes no fever.    Past medical history No pertinent past medical history There are no active problems to display for this patient.   Past Surgical History  Procedure Laterality Date  . Elbow surgery    . Tonsillectomy    . Leep      Current Outpatient Rx  Name  Route  Sig  Dispense  Refill  . ciprofloxacin (CIPRO) 500 MG tablet   Oral   Take 1 tablet (500 mg total) by mouth 2 (two) times daily.   14 tablet   0     Allergies Amoxicillin and Penicillins  No family history on file.  Social History Social History  Substance Use Topics  . Smoking status: Never Smoker   . Smokeless tobacco: None  . Alcohol Use: No    Review of Systems  Constitutional: Negative for fever. Eyes: Negative for visual changes.Positive for right lower eyelid swelling ENT: Negative for sore throat. Cardiovascular: Negative for chest pain. Respiratory: Negative for shortness of breath. Gastrointestinal: Negative for abdominal pain, vomiting and diarrhea. Genitourinary: Negative for dysuria. Musculoskeletal: Negative for back pain. Skin: Negative for rash. Neurological: Negative for headaches, focal weakness or numbness.   10-point ROS otherwise negative.  ____________________________________________   PHYSICAL EXAM:  VITAL SIGNS: ED Triage Vitals  Enc Vitals Group     BP 10/30/15 0548 156/95 mmHg     Pulse Rate 10/30/15 0548 71     Resp 10/30/15 0548 20     Temp 10/30/15 0548 98 F (36.7 C)     Temp  Source 10/30/15 0548 Oral     SpO2 10/30/15 0548 100 %     Weight 10/30/15 0548 160 lb (72.576 kg)     Height 10/30/15 0548 5\' 4"  (1.626 m)     Head Cir --      Peak Flow --      Pain Score 10/30/15 0547 6     Pain Loc --      Pain Edu? --      Excl. in GC? --     Constitutional: Alert and oriented. Well appearing and in no distress. Eyes: Conjunctivae are normal. PERRL. Normal extraocular movements.Right lower eyelid stye noted  ENT   Head: Normocephalic and atraumatic.   Nose: No congestion/rhinnorhea.   Mouth/Throat: Mucous membranes are moist.   Neck: No stridor. Neurologic:  Normal speech and language. No gross focal neurologic deficits are appreciated. Speech is normal.  Skin:  Skin is warm, dry and intact. No rash noted.      INITIAL IMPRESSION / ASSESSMENT AND PLAN / ED COURSE  Pertinent labs & imaging results that were available during my care of the patient were reviewed by me and considered in my medical decision making (see chart for details).     Final diagnoses:  Stye, right      Darci Currentandolph N Brown, MD 10/30/15 [phone]

## 2015-10-30 NOTE — ED Notes (Signed)
Pt discharged to home.  Discharge instructions reviewed.  Verbalized understanding.  No questions or concerns at this time.  Teach back verified.  Pt in NAD.  No items left in ED.   

## 2015-10-30 NOTE — Discharge Instructions (Signed)
Stye A stye is a bump on your eyelid caused by a bacterial infection. A stye can form inside the eyelid (internal stye) or outside the eyelid (external stye). An internal stye may be caused by an infected oil-producing gland inside your eyelid. An external stye may be caused by an infection at the base of your eyelash (hair follicle). Styes are very common. Anyone can get them at any age. They usually occur in just one eye, but you may have more than one in either eye.  CAUSES  The infection is almost always caused by bacteria called Staphylococcus aureus. This is a common type of bacteria that lives on your skin. RISK FACTORS You may be at higher risk for a stye if you have had one before. You may also be at higher risk if you have:  Diabetes.  Long-term illness.  Long-term eye redness.  A skin condition called seborrhea.  High fat levels in your blood (lipids). SIGNS AND SYMPTOMS  Eyelid pain is the most common symptom of a stye. Internal styes are more painful than external styes. Other signs and symptoms may include:  Painful swelling of your eyelid.  A scratchy feeling in your eye.  Tearing and redness of your eye.  Pus draining from the stye. DIAGNOSIS  Your health care provider may be able to diagnose a stye just by examining your eye. The health care provider may also check to make sure:  You do not have a fever or other signs of a more serious infection.  The infection has not spread to other parts of your eye or areas around your eye. TREATMENT  Most styes will clear up in a few days without treatment. In some cases, you may need to use antibiotic drops or ointment to prevent infection. Your health care provider may have to drain the stye surgically if your stye is:  Large.  Causing a lot of pain.  Interfering with your vision. This can be done using a thin blade or a needle.  HOME CARE INSTRUCTIONS   Take medicines only as directed by your health care  provider.  Apply a clean, warm compress to your eye for 10 minutes, 4 times a day.  Do not wear contact lenses or eye makeup until your stye has healed.  Do not try to pop or drain the stye. SEEK MEDICAL CARE IF:  You have chills or a fever.  Your stye does not go away after several days.  Your stye affects your vision.  Your eyeball becomes swollen, red, or painful. MAKE SURE YOU:  Understand these instructions.  Will watch your condition.  Will get help right away if you are not doing well or get worse.   This information is not intended to replace advice given to you by your health care provider. Make sure you discuss any questions you have with your health care provider.   Document Released: 02/02/2005 Document Revised: 05/16/2014 Document Reviewed: 08/09/2013 Elsevier Interactive Patient Education 2016 [employer]  

## 2015-10-30 NOTE — ED Notes (Signed)
Patient ambulatory to triage with steady gait, without difficulty or distress noted; pt reports awoke with swelling to right lower eyelid with itching

## 2016-05-03 ENCOUNTER — Emergency Department
Admission: EM | Admit: 2016-05-03 | Discharge: 2016-05-03 | Disposition: A | Discharge status: Home / Self Care | Payer: Managed Care, Other (non HMO) | Attending: Emergency Medicine | Admitting: Emergency Medicine

## 2016-05-03 ENCOUNTER — Encounter: Payer: Self-pay | Admitting: *Deleted

## 2016-05-03 DIAGNOSIS — L739 Follicular disorder, unspecified: Secondary | ICD-10-CM

## 2016-05-03 DIAGNOSIS — B372 Candidiasis of skin and nail: Secondary | ICD-10-CM | POA: Diagnosis not present

## 2016-05-03 DIAGNOSIS — R21 Rash and other nonspecific skin eruption: Secondary | ICD-10-CM | POA: Diagnosis present

## 2016-05-03 MED ORDER — DOXYCYCLINE HYCLATE 100 MG PO TABS: 100.0000 mg | ORAL_TABLET | Freq: Two times a day (BID) | ORAL | 0 refills | Status: DC

## 2016-05-03 MED ORDER — FLUCONAZOLE 150 MG PO TABS: 150.0000 mg | ORAL_TABLET | Freq: Once | ORAL | 0 refills | Status: AC

## 2016-05-03 NOTE — ED Triage Notes (Signed)
States she woke up with bumps under both armpits, states itching

## 2016-05-03 NOTE — Discharge Instructions (Signed)
Take the antibiotic and antifungal med as directed. Use hypo-allergenic skin cleansers as directed. Use OTC hydrocortisone creams as discussed. Follow-[facility] with your provider or [facility] as needed.

## 2016-05-04 NOTE — ED Provider Notes (Signed)
[facility] Emergency Department Provider Note ____________________________________________  Time seen: 1247  I have reviewed the triage vital signs and the nursing notes.  HISTORY  Chief Complaint  Abscess  HPI Sherri [name] is a 25 y.o. female visits to the ED for evaluation of bumps under her armpits. She also notes some itching and dry skin to the same area. She describes onset was about 2 weeks ago when she shaved her armpits for the first time in a long time. She reports somesmall white bumps to the areas as well as some dry flaky skin. She denies any fevers, chills, or sweats.  History reviewed. No pertinent past medical history.  There are no active problems to display for this patient.  Past Surgical History:  Procedure Laterality Date  . ELBOW SURGERY    . LEEP    . TONSILLECTOMY      Prior to Admission medications   Medication Sig Start Date End Date Taking? Authorizing Provider  ciprofloxacin (CIPRO) 500 MG tablet Take 1 tablet (500 mg total) by mouth 2 (two) times daily. 06/15/15   Jene Everyobert Kinner, MD  doxycycline (VIBRA-TABS) 100 MG tablet Take 1 tablet (100 mg total) by mouth 2 (two) times daily. 05/03/16   Constance Whittle V Bacon Christo Hain, PA-C   Allergies Amoxicillin and Penicillins  History reviewed. No pertinent family history.  Social History Social History  Substance Use Topics  . Smoking status: Never Smoker  . Smokeless tobacco: Not on file  . Alcohol use No    Review of Systems  Constitutional: Negative for fever. Cardiovascular: Negative for chest pain. Respiratory: Negative for shortness of breath. Skin: Positive for rash. Neurological: Negative for headaches, focal weakness or numbness. ____________________________________________  PHYSICAL EXAM:  VITAL SIGNS: ED Triage Vitals [05/03/16 1122]  Enc Vitals Group     BP (!) 139/93     Pulse Rate 68     Resp 18     Temp 98.4 F (36.9 C)     Temp Source Oral     SpO2  100 %     Weight 179 lb (81.2 kg)     Height 5\' 5"  (1.651 m)     Head Circumference      Peak Flow      Pain Score 7     Pain Loc      Pain Edu?      Excl. in GC?    Constitutional: Alert and oriented. Well appearing and in no distress. Head: Normocephalic and atraumatic. Cardiovascular: Normal rate, regular rhythm. Normal distal pulses. Respiratory: Normal respiratory effort. No wheezes/rales/rhonchi. Musculoskeletal: Nontender with normal range of motion in all extremities.  Neurologic:  Normal gait without ataxia. Normal speech and language. No gross focal neurologic deficits are appreciated. Skin:  Skin is warm, dry and intact. Patient with multiple scattered pustules to the armpits bilaterally. She also has some mild scaling to the skin as well. No nodules, abscesses, boils are appreciated. ____________________________________________  INITIAL IMPRESSION / ASSESSMENT AND PLAN / ED COURSE  Patient with what appears to be a mild folliculitis and some mild stomatitis to the bilateral armpits. She is discharged with prescription for doxycycline as well as a Diflucan dose as directed. She is advised to keep the area clean and apply warm compresses as needed. She should avoid shaving with a razor until symptoms are completely resolved she may also use over-the-counter antifungal cream to the armpits as necessary. She is to follow with primary provider for ongoing symptoms.  Clinical  Course    ____________________________________________  FINAL CLINICAL IMPRESSION(S) / ED DIAGNOSES  Final diagnoses:  Folliculitis  Yeast dermatitis      Lissa HoardJenise V Bacon Michaela Shankel, PA-C 05/04/16 1918    Jene Everyobert Kinner, MD 05/06/16 [phone]

## 2016-06-14 LAB — HM PAP SMEAR: HM Pap smear: NEGATIVE

## 2017-09-07 LAB — HM HIV SCREENING LAB: HM HIV Screening: NEGATIVE

## 2018-06-21 DIAGNOSIS — E663 Overweight: Secondary | ICD-10-CM

## 2018-11-07 ENCOUNTER — Ambulatory Visit: Payer: Self-pay

## 2018-11-16 ENCOUNTER — Other Ambulatory Visit: Payer: Self-pay

## 2018-11-16 ENCOUNTER — Ambulatory Visit (LOCAL_COMMUNITY_HEALTH_CENTER): Payer: Self-pay | Admitting: Physician Assistant

## 2018-11-16 ENCOUNTER — Encounter: Payer: Self-pay | Admitting: Physician Assistant

## 2018-11-16 VITALS — BP 128/84 | Ht 65.0 in | Wt 181.0 lb

## 2018-11-16 DIAGNOSIS — Z3042 Encounter for surveillance of injectable contraceptive: Secondary | ICD-10-CM

## 2018-11-16 DIAGNOSIS — Z30013 Encounter for initial prescription of injectable contraceptive: Secondary | ICD-10-CM

## 2018-11-16 DIAGNOSIS — Z3009 Encounter for other general counseling and advice on contraception: Secondary | ICD-10-CM

## 2018-11-16 DIAGNOSIS — Z113 Encounter for screening for infections with a predominantly sexual mode of transmission: Secondary | ICD-10-CM

## 2018-11-16 MED ORDER — MEDROXYPROGESTERONE ACETATE 150 MG/ML IM SUSP
150.0000 mg | Freq: Once | INTRAMUSCULAR | Status: AC
  Administered 2018-11-16: 150 mg via INTRAMUSCULAR

## 2018-11-16 NOTE — Progress Notes (Signed)
Last physical with [facility] was 09/07/2017. Last depo with [facility] was 08/14/2018; pt 13.3 weeks post depo.

## 2018-11-16 NOTE — Progress Notes (Signed)
Provider orders completed. 

## 2018-11-16 NOTE — Progress Notes (Signed)
Family Planning Visit- Repeat Yearly Visit  Subjective:  Sherri [name] is a 28 y.o. being seen today for an well woman visit and to get a Depo and HIV/RPR testing.    She is currently using Depo-Provera injections for pregnancy prevention. Patient reports she does not want a pregnancy in the next year. Patient has the following medical conditionshas Overweight on their problem list.  Chief Complaint  Patient presents with  . Contraception    desires depo (in hip)  . SEXUALLY TRANSMITTED DISEASE    desires STD screens    Patient reports no problems with the Depo.  Requests HIV and RPR today but declines a pelvic exam.    Patient denies any concerns and changes in history since last visit.   Does the patient desire a pregnancy in the next year? (OKQ flowsheet)  See flowsheet for other program required questions.   Body mass index is 30.12 kg/m. - Patient is eligible for diabetes screening based on BMI and age >65?  not applicable KP5W ordered? not applicable  Patient reports 1 of partners in last year. Desires STI screening?  Yes  Does the patient have a current or past history of drug use? No   No components found for: HCV]   Health Maintenance Due  Topic Date Due  . TETANUS/TDAP  02/22/2010    Review of Systems  All other systems reviewed and are negative.   The following portions of the patient's history were reviewed and updated as appropriate: allergies, current medications, past family history, past medical history, past social history, past surgical history and problem list. Problem list updated.  Objective:   Vitals:   11/16/18 1024  BP: (!) 140/92  Weight: 181 lb (82.1 kg)  Height: 5\' 5"  (1.651 m)    Physical Exam Vitals signs reviewed.  Constitutional:      General: She is not in acute distress.    Appearance: Normal appearance.  HENT:     Head: Normocephalic and atraumatic.  Neurological:     Mental Status: She is alert and oriented to person,  place, and time.  Psychiatric:        Mood and Affect: Mood normal.        Behavior: Behavior normal.        Thought Content: Thought content normal.        Judgment: Judgment normal.    Patient declines pelvic exam today.  BP recheck today 128/84.   Assessment and Plan:  Sherri [name] is a 28 y.o. female presenting to the [facility] Department for an initial well woman exam/family planning visit  Contraception counseling: Reviewed all forms of birth control options available including abstinence; over the counter/barrier methods; hormonal contraceptive medication including pill, patch, ring, injection,contraceptive implant; hormonal and nonhormonal IUDs; permanent sterilization options including vasectomy and the various tubal sterilization modalities. Risks and benefits reviewed.  Questions were answered.  Written information was also given to the patient to review.  Patient desires Depo, this was prescribed for patient. She will follow up in  3 months for surveillance.  She was told to call with any further questions, or with any concerns about this method of contraception.  Emphasized use of condoms 100% of the time for STI prevention.  1. Encounter for counseling regarding contraception  Continue with Depo 150mg  IM q 11-13 weeks x 2 Rec condoms with all sex for STD protection RTC 3 months for RP if we are doing PE or Depo only if we  are not. - medroxyPROGESTERone (DEPO-PROVERA) injection 150 mg  2. Screening for STD (sexually transmitted disease) Counseled that RN will call if needs to RTC for any treatment once results are back. - HIV [facility] - Syphilis Serology, [facility]  3. Surveillance for Depo-Provera contraception Give Depo per above order in hip per patient.  - medroxyPROGESTERone (DEPO-PROVERA) injection 150 mg  4.  Elevated BP today Recheck BP is WNL.  Rec patient monitor and follow up with PCP if is elevated 3 or more times when she checks it at  home or pharmacy.   No follow-ups on file.  No future appointments.  Matt Holmesarla J Iisha Soyars, PA

## 2019-02-06 ENCOUNTER — Ambulatory Visit: Payer: Self-pay

## 2019-02-14 ENCOUNTER — Other Ambulatory Visit: Payer: Self-pay

## 2019-02-14 ENCOUNTER — Ambulatory Visit (LOCAL_COMMUNITY_HEALTH_CENTER): Payer: Self-pay

## 2019-02-14 VITALS — BP 134/85 | Ht 65.0 in | Wt 176.0 lb

## 2019-02-14 DIAGNOSIS — Z3009 Encounter for other general counseling and advice on contraception: Secondary | ICD-10-CM

## 2019-02-14 DIAGNOSIS — Z30013 Encounter for initial prescription of injectable contraceptive: Secondary | ICD-10-CM

## 2019-02-14 DIAGNOSIS — Z3042 Encounter for surveillance of injectable contraceptive: Secondary | ICD-10-CM

## 2019-02-14 MED ORDER — MEDROXYPROGESTERONE ACETATE 150 MG/ML IM SUSP
150.0000 mg | Freq: Once | INTRAMUSCULAR | Status: AC
  Administered 2019-02-14: 16:00:00 150 mg via INTRAMUSCULAR

## 2019-02-14 NOTE — Progress Notes (Signed)
Depo given per C. Hamptom PA order on 11/16/2018. Tolerated well Aileen Fass, RN

## 2019-03-25 ENCOUNTER — Encounter: Payer: Self-pay | Admitting: Emergency Medicine

## 2019-03-25 ENCOUNTER — Other Ambulatory Visit: Payer: Self-pay

## 2019-03-25 DIAGNOSIS — R0789 Other chest pain: Secondary | ICD-10-CM | POA: Insufficient documentation

## 2019-03-25 NOTE — ED Triage Notes (Signed)
Patient ambulatory to triage with steady gait, without difficulty or distress noted, mask in place; pt reports left sided CP radiating under left breast and into back for several wks, increased last 2 days with no accomp symptoms

## 2019-03-26 ENCOUNTER — Emergency Department
Admission: EM | Admit: 2019-03-26 | Discharge: 2019-03-26 | Disposition: A | Discharge status: Home / Self Care | Payer: PRIVATE HEALTH INSURANCE | Attending: Student | Admitting: Student

## 2019-03-26 ENCOUNTER — Emergency Department: Payer: PRIVATE HEALTH INSURANCE

## 2019-03-26 DIAGNOSIS — R079 Chest pain, unspecified: Secondary | ICD-10-CM

## 2019-03-26 LAB — CBC WITH DIFFERENTIAL/PLATELET
Abs Immature Granulocytes: 0.01 10*3/uL (ref 0.00–0.07)
Basophils Absolute: 0 10*3/uL (ref 0.0–0.1)
Basophils Relative: 1 %
Eosinophils Absolute: 0.1 10*3/uL (ref 0.0–0.5)
Eosinophils Relative: 1 %
HCT: 35.5 % — ABNORMAL LOW (ref 36.0–46.0)
Hemoglobin: 11.5 g/dL — ABNORMAL LOW (ref 12.0–15.0)
Immature Granulocytes: 0 %
Lymphocytes Relative: 55 %
Lymphs Abs: 4.6 10*3/uL — ABNORMAL HIGH (ref 0.7–4.0)
MCH: 28.9 pg (ref 26.0–34.0)
MCHC: 32.4 g/dL (ref 30.0–36.0)
MCV: 89.2 fL (ref 80.0–100.0)
Monocytes Absolute: 0.7 10*3/uL (ref 0.1–1.0)
Monocytes Relative: 8 %
Neutro Abs: 2.9 10*3/uL (ref 1.7–7.7)
Neutrophils Relative %: 35 %
Platelets: 264 10*3/uL (ref 150–400)
RBC: 3.98 MIL/uL (ref 3.87–5.11)
RDW: 13.2 % (ref 11.5–15.5)
WBC: 8.2 10*3/uL (ref 4.0–10.5)
nRBC: 0 % (ref 0.0–0.2)

## 2019-03-26 LAB — COMPREHENSIVE METABOLIC PANEL
ALT: 14 U/L (ref 0–44)
AST: 16 U/L (ref 15–41)
Albumin: 4.4 g/dL (ref 3.5–5.0)
Alkaline Phosphatase: 62 U/L (ref 38–126)
Anion gap: 10 (ref 5–15)
BUN: 19 mg/dL (ref 6–20)
CO2: 22 mmol/L (ref 22–32)
Calcium: 9.4 mg/dL (ref 8.9–10.3)
Chloride: 108 mmol/L (ref 98–111)
Creatinine, Ser: 0.79 mg/dL (ref 0.44–1.00)
GFR calc Af Amer: >60 mL/min (ref >60)
GFR calc non Af Amer: >60 mL/min (ref >60)
Glucose, Bld: 107 mg/dL — ABNORMAL HIGH (ref 70–99)
Potassium: 4.4 mmol/L (ref 3.5–5.1)
Sodium: 140 mmol/L (ref 135–145)
Total Bilirubin: 0.8 mg/dL (ref 0.3–1.2)
Total Protein: 7.9 g/dL (ref 6.5–8.1)

## 2019-03-26 LAB — TROPONIN I (HIGH SENSITIVITY): Troponin I (High Sensitivity): 2 ng/L (ref <18)

## 2019-03-26 NOTE — ED Notes (Signed)
ED Provider at bedside. 

## 2019-03-26 NOTE — ED Provider Notes (Signed)
[facility] Emergency Department Provider Note  ____________________________________________   First MD Initiated Contact with Patient 03/26/19 0244     (approximate)  I have reviewed the triage vital signs and the nursing notes.  History  Chief Complaint Chest Pain    HPI Sherri [name] is a 28 y.o. female with a history of stomach ulcer, who presents emergency department for intermittent episodes of chest pain.  Patient reports episodes have been occurring over the last several weeks.  They have increased in frequency over the last several days, which prompted evaluation.  These episodes seem to come and go at random without any identifiable alleviating or aggravating factors.  The chest pain is primarily central in location, described as sharp.  Mild in severity.  Occasionally the chest pain will radiate from the center of her chest, along and under the left breast.  She denies any associated shortness of breath, nausea, diaphoresis, rashes, fever, or cough.  She does report that she does a lot of heavy lifting and physical labor at her work.  She also walks 2 large dogs daily.  She does have a family history of CAD.  She denies any history of VTE, leg swelling, prolonged travel.  She has not tried anything for her symptoms.   Past Medical Hx Past Medical History:  Diagnosis Date  . ADHD   . Depression   . Stomach ulcer     Problem List Patient Active Problem List   Diagnosis Date Noted  . Overweight 05/14/2015    Past Surgical Hx Past Surgical History:  Procedure Laterality Date  . ELBOW SURGERY    . LEEP    . TONSILLECTOMY      Medications Prior to Admission medications   Medication Sig Start Date End Date Taking? Authorizing Provider  ciprofloxacin (CIPRO) 500 MG tablet Take 1 tablet (500 mg total) by mouth 2 (two) times daily. Patient not taking: Reported on 11/16/2018 06/15/15   Lavonia Drafts, MD  doxycycline (VIBRA-TABS) 100 MG tablet  Take 1 tablet (100 mg total) by mouth 2 (two) times daily. Patient not taking: Reported on 11/16/2018 05/03/16   Menshew, Dannielle Karvonen, PA-C    Allergies Amoxicillin, Penicillins, and Mushroom extract complex  Family Hx Family History  Problem Relation Age of Onset  . Hypertension Mother   . Throat cancer Mother   . Diabetes Mother   . Heart attack Father   . Colon cancer Paternal Grandmother   . Heart disease Paternal Grandmother   . Diabetes Maternal Grandmother   . Heart attack Maternal Grandmother   . Heart disease Maternal Grandmother   . Depression Maternal Grandmother   . Heart disease Maternal Grandfather   . Kidney disease Maternal Grandfather     Social Hx Social History   Tobacco Use  . Smoking status: Never Smoker  . Smokeless tobacco: Never Used  Substance Use Topics  . Alcohol use: No  . Drug use: Not on file     Review of Systems  Constitutional: Negative for fever, chills. Eyes: Negative for visual changes. ENT: Negative for sore throat. Cardiovascular: + for chest pain. Respiratory: Negative for shortness of breath. Gastrointestinal: Negative for nausea, vomiting.  Genitourinary: Negative for dysuria. Musculoskeletal: Negative for leg swelling. Skin: Negative for rash. Neurological: Negative for for headaches.   Physical Exam  Vital Signs: ED Triage Vitals  Enc Vitals Group     BP 03/26/19 0000 (!) 154/106     Pulse Rate 03/26/19 0000 78  Resp 03/26/19 0000 18     Temp 03/26/19 0000 99.1 F (37.3 C)     Temp Source 03/26/19 0000 Oral     SpO2 03/26/19 0000 100 %     Weight 03/25/19 2358 175 lb (79.4 kg)     Height 03/25/19 2358 5\' 6"  (1.676 m)     Head Circumference --      Peak Flow --      Pain Score 03/25/19 2358 8     Pain Loc --      Pain Edu? --      Excl. in GC? --     Constitutional: Alert and oriented.  Head: Normocephalic. Atraumatic. Eyes: Conjunctivae clear. Sclera anicteric. Nose: No congestion. No  rhinorrhea. Mouth/Throat: Wearing mask.  Neck: No stridor.   Cardiovascular: Normal rate, regular rhythm. Extremities well perfused. Chest: Chest pain is reproducible upon palpation of the left-sided costochondral junction. Respiratory: Normal respiratory effort.  Lungs CTAB. Gastrointestinal: Soft. Non-tender. Non-distended.  Musculoskeletal: No lower extremity edema. No deformities. Neurologic:  Normal speech and language. No gross focal neurologic deficits are appreciated.  Skin: Skin is warm, dry and intact. No rash noted.  No vesicles or lesions. Psychiatric: Mood and affect are appropriate for situation.  EKG  Personally reviewed.   Rate: 68 Rhythm: sinus Axis: normal Intervals: WNL No acute ischemic changes No STEMI    Radiology  XR: normal study.   Procedures  Procedure(s) performed (including critical care):  Procedures   Initial Impression / Assessment and Plan / ED Course  28 y.o. female who presents to the ED for intermittent episodes of chest pain over the last several weeks, as above.  Ddx: GERD/related to her known ulcer history, atypical ACS, costochondritis/MSK.  She has no associated SOB, leg swelling, recent travel, tachycardia, tachypnea, or hypoxia, doubt PE. No findings on skin exam to suggest shingles.   Work-up reveals normal XR.  EKG without acute ischemic changes.  Troponin negative.  Bilirubin and LFTs within normal limits.  She is low risk HEART score, given this and length of symptoms do not feel delta troponin is necessary.  Plan for discharge. Will advise supportive care.  Given her ulcer history, will avoid ibuprofen, recommend Tylenol, ice, rest as able.  Continue her GERD medications.  Advised PCP follow-up and given return precautions.  Patient voices understanding and is comfortable with the plan and discharge.   Final Clinical Impression(s) / ED Diagnosis  Final diagnoses:  Chest pain in adult       Note:  This document was  prepared using Dragon voice recognition software and may include unintentional dictation errors.   26., MD 03/26/19 [phone]

## 2019-03-26 NOTE — Discharge Instructions (Addendum)
You can take Tylenol 650 mg every 4-6 hours as needed. Do not take more than 6 doses in one day.   Continue to take your medications for your stomach.  You can also use ice to help with pain/symptom control.  As much as you can, avoid heavy lifting for the next 5-7 days.   Please follow-up with your regular doctor to review your ER visit and follow-up on your symptoms.  Please return to the emergency department for any new or worsening symptoms.

## 2019-05-16 ENCOUNTER — Ambulatory Visit: Payer: Self-pay

## 2019-06-05 ENCOUNTER — Other Ambulatory Visit: Payer: Self-pay

## 2019-06-05 ENCOUNTER — Ambulatory Visit (LOCAL_COMMUNITY_HEALTH_CENTER): Payer: Self-pay

## 2019-06-05 VITALS — BP 130/87 | Ht 65.0 in | Wt 188.5 lb

## 2019-06-05 DIAGNOSIS — Z30013 Encounter for initial prescription of injectable contraceptive: Secondary | ICD-10-CM

## 2019-06-05 DIAGNOSIS — Z3009 Encounter for other general counseling and advice on contraception: Secondary | ICD-10-CM

## 2019-06-05 MED ORDER — MULTI-VITAMIN/MINERALS PO TABS: 1.0000 | ORAL_TABLET | Freq: Every day | ORAL | 0 refills | Status: AC

## 2019-06-05 MED ORDER — MEDROXYPROGESTERONE ACETATE 150 MG/ML IM SUSP
150.0000 mg | Freq: Once | INTRAMUSCULAR | Status: AC
  Administered 2019-06-05: 09:00:00 150 mg via INTRAMUSCULAR

## 2019-06-05 NOTE — Progress Notes (Signed)
Presents for Depo injection at 15 weeks and 6 days after last injection 02/14/2019. Counseled best to receive Depo as ordererd at every 11 - 13 weeks. As physical overdue and 11/2018 order for Depo injections x2 (per [facility]) has been used, consult with E. Sciora CNM for Depo order. Review of Centricity indicates client is due for PAP 06/2019 per chart summary directives. Per verbal order of Ms. Sciora CNM: 1) Depo 150 mg IM x1 today and 2) client to schedule provider appt in 06/2019 or when next Depo due as PAP needed. Per client, most likely will schedule appt when Depo due 08/21/2019 for PAP. Client tolerated Depo today without complaint. Jossie Ng, RN

## 2019-08-13 ENCOUNTER — Telehealth: Payer: Self-pay | Admitting: Family Medicine

## 2019-08-13 NOTE — Telephone Encounter (Signed)
TC with patient.  Informed there are no restriction with Depo and Covid vaccine. Patient verbalized understanding. Richmond Campbell, RN

## 2019-08-13 NOTE — Telephone Encounter (Signed)
patient is going to get covid vaccine on friday the 9th and her depo shot is due on the14th she wanted to know if she had to wait longer for her to get her depo shot?

## 2019-08-20 ENCOUNTER — Ambulatory Visit: Payer: Self-pay

## 2019-08-22 ENCOUNTER — Ambulatory Visit: Payer: Self-pay

## 2019-08-27 ENCOUNTER — Ambulatory Visit: Payer: Self-pay

## 2019-08-30 ENCOUNTER — Ambulatory Visit: Payer: Self-pay

## 2019-08-30 ENCOUNTER — Ambulatory Visit (LOCAL_COMMUNITY_HEALTH_CENTER): Payer: Self-pay | Admitting: Advanced Practice Midwife

## 2019-08-30 ENCOUNTER — Other Ambulatory Visit: Payer: Self-pay

## 2019-08-30 VITALS — Ht 65.0 in | Wt 191.4 lb

## 2019-08-30 DIAGNOSIS — Z3009 Encounter for other general counseling and advice on contraception: Secondary | ICD-10-CM

## 2019-08-30 DIAGNOSIS — E669 Obesity, unspecified: Secondary | ICD-10-CM | POA: Insufficient documentation

## 2019-08-30 DIAGNOSIS — Z30013 Encounter for initial prescription of injectable contraceptive: Secondary | ICD-10-CM

## 2019-08-30 DIAGNOSIS — F1290 Cannabis use, unspecified, uncomplicated: Secondary | ICD-10-CM | POA: Insufficient documentation

## 2019-08-30 DIAGNOSIS — Z3042 Encounter for surveillance of injectable contraceptive: Secondary | ICD-10-CM

## 2019-08-30 MED ORDER — MULTIVITAMINS PO CAPS: 1.0000 | ORAL_CAPSULE | Freq: Every day | ORAL | 0 refills | Status: AC

## 2019-08-30 MED ORDER — MEDROXYPROGESTERONE ACETATE 150 MG/ML IM SUSP
150.0000 mg | INTRAMUSCULAR | Status: AC
  Administered 2019-08-30 – 2020-05-12 (×4): 150 mg via INTRAMUSCULAR

## 2019-08-30 NOTE — Progress Notes (Addendum)
Here today for Depo and Pap. Last PE here was 09/07/2017, last Pap Smear was 06/14/2016 and last Depo was 1/27/202 (12.2 weeks.) Declines STD screening. Tawny Hopping, RN

## 2019-08-30 NOTE — Progress Notes (Signed)
   [facility] problem visit  [facility]  Subjective:  Sherri [name] is a 29 y.o. SBF nullip smoker of MJ being seen today for pap and DMPA  Chief Complaint  Patient presents with  . Contraception    Depo  . Gynecologic Exam    Pap Smear    HPI LMP: ? Not while on DMPA.  Last pap 06/14/16 neg HPV neg.  Hx LEEP [facility] 2015.  Last sex 08/26/19 without condom.  Last DMPA 06/05/19.  Does the patient have a current or past history of drug use? Yes   No components found for: HCV]   Health Maintenance Due  Topic Date Due  . COVID-19 Vaccine (1) Never done  . TETANUS/TDAP  Never done  . PAP-Cervical Cytology Screening  06/15/2019  . PAP SMEAR-Modifier  06/15/2019    ROS  The following portions of the patient's history were reviewed and updated as appropriate: allergies, current medications, past family history, past medical history, past social history, past surgical history and problem list. Problem list updated.   See flowsheet for other program required questions.  Objective:   Vitals:   08/30/19 1011  Weight: 191 lb 6.4 oz (86.8 kg)  Height: 5\' 5"  (1.651 m)    Physical Exam  n/a  Assessment and Plan:  Sherri [name] is a 29 y.o. female presenting to the [facility] Department for a Women's Health problem visit  1. Obesity, unspecified classification, unspecified obesity type, unspecified whether serious comorbidity present   2. Family planning Feels well.  Happy with DMPA and wants more.  Last MJ this am--counseled not to use - IGP, rfx Aptima HPV ASCU  3. Encounter for surveillance of injectable contraceptive May have DMPA 150 mg IM q 11-13 wks x 1 year     Return for 11-13 wk DMPA.  No future appointments.  June, CNM

## 2019-08-30 NOTE — Progress Notes (Signed)
Depo given and tolerated well. MVI's given declines condoms. Tawny Hopping, RN

## 2019-09-01 LAB — IGP, RFX APTIMA HPV ASCU: PAP Smear Comment: 0

## 2019-09-02 ENCOUNTER — Encounter: Payer: Self-pay | Admitting: Advanced Practice Midwife

## 2019-09-02 DIAGNOSIS — O3440 Maternal care for other abnormalities of cervix, unspecified trimester: Secondary | ICD-10-CM | POA: Insufficient documentation

## 2019-11-18 ENCOUNTER — Ambulatory Visit: Payer: Self-pay

## 2019-11-19 ENCOUNTER — Telehealth: Payer: Self-pay | Admitting: General Practice

## 2019-11-19 NOTE — Telephone Encounter (Signed)
Individual has been contacted 3+ times regarding ED referral and has been given information regarding the clinic. No further attempts to contact individual will be made. 

## 2019-11-22 ENCOUNTER — Ambulatory Visit: Payer: Self-pay

## 2019-12-02 ENCOUNTER — Ambulatory Visit: Payer: Self-pay

## 2019-12-03 ENCOUNTER — Other Ambulatory Visit: Payer: Self-pay

## 2019-12-03 ENCOUNTER — Ambulatory Visit (LOCAL_COMMUNITY_HEALTH_CENTER): Payer: Self-pay

## 2019-12-03 VITALS — BP 123/84 | Ht 65.5 in | Wt 191.0 lb

## 2019-12-03 DIAGNOSIS — Z3042 Encounter for surveillance of injectable contraceptive: Secondary | ICD-10-CM

## 2019-12-03 DIAGNOSIS — Z3009 Encounter for other general counseling and advice on contraception: Secondary | ICD-10-CM

## 2019-12-03 DIAGNOSIS — Z30013 Encounter for initial prescription of injectable contraceptive: Secondary | ICD-10-CM

## 2019-12-03 NOTE — Progress Notes (Signed)
Pt is 13.4 weeks post depo today. DMPA 150 mg IM administered today per Arnetha Courser, CNM dated 08/30/19.

## 2020-02-24 ENCOUNTER — Ambulatory Visit: Payer: Self-pay

## 2020-02-25 ENCOUNTER — Other Ambulatory Visit: Payer: Self-pay

## 2020-02-25 ENCOUNTER — Ambulatory Visit (LOCAL_COMMUNITY_HEALTH_CENTER): Payer: Self-pay

## 2020-02-25 VITALS — BP 114/79 | Ht 65.5 in | Wt 192.0 lb

## 2020-02-25 DIAGNOSIS — Z3042 Encounter for surveillance of injectable contraceptive: Secondary | ICD-10-CM

## 2020-02-25 DIAGNOSIS — Z3009 Encounter for other general counseling and advice on contraception: Secondary | ICD-10-CM

## 2020-02-25 NOTE — Progress Notes (Signed)
Pt is 12.0 weeks post depo today.  DMPA 150 mg IM administered today per Arnetha Courser, CNM order dated 08/30/19.

## 2020-05-12 ENCOUNTER — Ambulatory Visit (LOCAL_COMMUNITY_HEALTH_CENTER): Payer: PRIVATE HEALTH INSURANCE

## 2020-05-12 ENCOUNTER — Ambulatory Visit: Payer: PRIVATE HEALTH INSURANCE

## 2020-05-12 ENCOUNTER — Other Ambulatory Visit: Payer: Self-pay

## 2020-05-12 VITALS — BP 140/90 | Wt 188.0 lb

## 2020-05-12 DIAGNOSIS — Z3042 Encounter for surveillance of injectable contraceptive: Secondary | ICD-10-CM

## 2020-05-12 DIAGNOSIS — Z3009 Encounter for other general counseling and advice on contraception: Secondary | ICD-10-CM

## 2020-05-13 NOTE — Progress Notes (Unsigned)
Depo given per E. Sciora order 08/2019; tolerated well.Richmond Campbell, RN

## 2020-07-23 DIAGNOSIS — R102 Pelvic and perineal pain: Secondary | ICD-10-CM | POA: Insufficient documentation

## 2020-07-23 HISTORY — DX: Pelvic and perineal pain: R10.2

## 2020-08-07 ENCOUNTER — Ambulatory Visit: Payer: Self-pay

## 2020-08-18 ENCOUNTER — Encounter: Payer: Self-pay | Admitting: Family Medicine

## 2020-08-18 ENCOUNTER — Other Ambulatory Visit: Payer: Self-pay

## 2020-08-18 ENCOUNTER — Ambulatory Visit (LOCAL_COMMUNITY_HEALTH_CENTER): Payer: PRIVATE HEALTH INSURANCE | Admitting: Family Medicine

## 2020-08-18 VITALS — BP 138/89 | Ht 65.0 in | Wt 187.6 lb

## 2020-08-18 DIAGNOSIS — Z113 Encounter for screening for infections with a predominantly sexual mode of transmission: Secondary | ICD-10-CM

## 2020-08-18 DIAGNOSIS — Z0000 Encounter for general adult medical examination without abnormal findings: Secondary | ICD-10-CM

## 2020-08-18 DIAGNOSIS — Z30013 Encounter for initial prescription of injectable contraceptive: Secondary | ICD-10-CM

## 2020-08-18 DIAGNOSIS — Z3009 Encounter for other general counseling and advice on contraception: Secondary | ICD-10-CM

## 2020-08-18 MED ORDER — MEDROXYPROGESTERONE ACETATE 150 MG/ML IM SUSP
150.0000 mg | INTRAMUSCULAR | Status: AC
  Administered 2020-08-18 – 2021-04-28 (×4): 150 mg via INTRAMUSCULAR

## 2020-08-18 NOTE — Progress Notes (Addendum)
Here today for PE and Depo. Last PE here was 09/07/2017, last Pap Smear was 08/30/2019 (NIL.) Last Depo was 05/12/2020 (14.0 weeks.) Wants to continue with Depo and wants all STD screening today. Tawny Hopping, RN

## 2020-08-18 NOTE — Progress Notes (Signed)
Depo given and tolerated well. Ivanka Kirshner, RN  

## 2020-08-18 NOTE — Progress Notes (Signed)
Family Planning Visit- Repeat Yearly Visit  Subjective:  Sherri [name] is a 30 y.o. G0P0000  being seen today for an annual wellness visit and to discuss contraception options.   The patient is currently using Depo Provera for pregnancy prevention. Patient does not want a pregnancy in the next year. Patient has the following medical problems: has Obesity BMI=31.8; Marijuana use; and Hx of LEEP (loop electrosurgical excision procedure) 2015 [facility] on their problem list.  Chief Complaint  Patient presents with  . Gynecologic Exam  . Contraception    Patient reports here for physical  and depo   Patient denies any problems or concerns See flowsheet for other program required questions.   Body mass index is 31.22 kg/m. - Patient is eligible for diabetes screening based on BMI and age >53?  not applicable HA1C ordered? not applicable  Patient reports 1 of partners in last year. Desires STI screening?  No - only screening for HIV and syphilis    Has patient been screened once for HCV in the past?  No  No results found for: HCVAB  Does the patient have current of drug use, have a partner with drug use, and/or has been incarcerated since last result? No  If yes-- Screen for HCV through [facility]   Does the patient meet criteria for HBV testing? No  Criteria:  -Household, sexual or needle sharing contact with HBV -History of drug use -HIV positive -Those with known Hep C   Health Maintenance Due  Topic Date Due  . Hepatitis C Screening  Never done  . COVID-19 Vaccine (1) Never done  . TETANUS/TDAP  Never done  . PAP-Cervical Cytology Screening  06/15/2019    Review of Systems  Constitutional: Negative for chills, fever, malaise/fatigue and weight loss.  HENT: Negative for congestion, hearing loss and sore throat.   Eyes: Negative for blurred vision, double vision and photophobia.  Respiratory: Negative for shortness of breath.   Cardiovascular: Negative for chest pain.   Gastrointestinal: Negative for abdominal pain, blood in stool, constipation, diarrhea, heartburn, nausea and vomiting.  Genitourinary: Negative for dysuria and frequency.  Musculoskeletal: Negative for back pain, joint pain and neck pain.  Skin: Negative for itching and rash.  Neurological: Negative for dizziness, weakness and headaches.  Endo/Heme/Allergies: Does not bruise/bleed easily.  Psychiatric/Behavioral: Negative for depression, substance abuse and suicidal ideas.    The following portions of the patient's history were reviewed and updated as appropriate: allergies, current medications, past family history, past medical history, past social history, past surgical history and problem list. Problem list updated.  Objective:   Vitals:   08/18/20 1546  BP: 138/89  Weight: 187 lb 9.6 oz (85.1 kg)  Height: 5\' 5"  (1.651 m)    Physical Exam Vitals and nursing note reviewed.  Constitutional:      Appearance: Normal appearance.  HENT:     Head: Normocephalic and atraumatic.     Mouth/Throat:     Mouth: Mucous membranes are moist.     Pharynx: Oropharynx is clear. No oropharyngeal exudate or posterior oropharyngeal erythema.  Neck:     Thyroid: No thyroid mass, thyromegaly or thyroid tenderness.  Pulmonary:     Effort: Pulmonary effort is normal.  Chest:  Breasts:     Right: No axillary adenopathy or supraclavicular adenopathy.     Left: No axillary adenopathy or supraclavicular adenopathy.    Abdominal:     General: Abdomen is flat.     Palpations: There is no  mass.     Tenderness: There is no abdominal tenderness. There is no rebound.  Genitourinary:    General: Normal vulva.     Exam position: Lithotomy position.     Pubic Area: No rash or pubic lice.      Labia:        Right: No rash or lesion.        Left: No rash or lesion.      Vagina: Normal. No vaginal discharge, erythema, bleeding or lesions.     Cervix: No cervical motion tenderness, discharge,  friability, lesion or erythema.     Uterus: Normal.      Adnexa: Right adnexa normal and left adnexa normal.     Rectum: Normal.  Lymphadenopathy:     Head:     Right side of head: No preauricular or posterior auricular adenopathy.     Left side of head: No preauricular or posterior auricular adenopathy.     Cervical: No cervical adenopathy.     Upper Body:     Right upper body: No supraclavicular or axillary adenopathy.     Left upper body: No supraclavicular or axillary adenopathy.     Lower Body: No right inguinal adenopathy. No left inguinal adenopathy.  Skin:    General: Skin is warm and dry.     Findings: No rash.  Neurological:     Mental Status: She is alert and oriented to person, place, and time.       Assessment and Plan:  Sherri [name] is a 30 y.o. female G0P0000 presenting to the [facility] Department for an yearly wellness and contraception visit   1. Routine general medical examination at a health care facility Patient in clinic for well woman exam, no concerns today.    Breasts:        Right: Normal. No swelling, mass, nipple discharge, skin change or tenderness.        Left: Normal. No swelling, mass, nipple discharge, skin change or tenderness.   Last PAP was 08/2019     2. Screening examination for venereal disease 05/14/20 was screened  Got GC/CT and desires only HIV and  RPR testing today and no partners since last  testing.    - HIV [place] [facility] - Syphilis Serology, [facility]  3. Encounter for initial prescription of injectable contraceptive 4. Family planning counseling  - medroxyPROGESTERone (DEPO-PROVERA) injection 150 mg  Contraception counseling: Reviewed all forms of birth control options in the tiered based approach. available including abstinence; over the counter/barrier methods; hormonal contraceptive medication including pill, patch, ring, injection,contraceptive implant, ECP; hormonal and nonhormonal IUDs; permanent  sterilization options including vasectomy and the various tubal sterilization modalities. Risks, benefits, and typical effectiveness rates were reviewed.  Questions were answered.  Written information was also given to the patient to review.  Patient desires depo , this was prescribed for patient. She will follow up in  11-13 weeks  for surveillance.  She was told to call with any further questions, or with any concerns about this method of contraception.  Emphasized use of condoms 100% of the time for STI prevention.  Patient was not offered ECP based on using depo for [facility].     Return in about 3 months (around 11/17/2020) for depo.  No future appointments.  Wendi Snipes, FNP

## 2020-08-25 LAB — HM HIV SCREENING LAB: HM HIV Screening: NEGATIVE

## 2020-11-06 ENCOUNTER — Other Ambulatory Visit: Payer: Self-pay

## 2020-11-06 ENCOUNTER — Ambulatory Visit (LOCAL_COMMUNITY_HEALTH_CENTER): Payer: PRIVATE HEALTH INSURANCE

## 2020-11-06 VITALS — BP 128/82 | HR 66 | Ht 65.0 in | Wt 185.0 lb

## 2020-11-06 DIAGNOSIS — Z3009 Encounter for other general counseling and advice on contraception: Secondary | ICD-10-CM

## 2020-11-06 DIAGNOSIS — Z3042 Encounter for surveillance of injectable contraceptive: Secondary | ICD-10-CM

## 2020-11-06 DIAGNOSIS — Z30013 Encounter for initial prescription of injectable contraceptive: Secondary | ICD-10-CM | POA: Diagnosis not present

## 2020-11-06 NOTE — Progress Notes (Signed)
11 weeks 3 days post depo. Voices no concerns. Depo given today per Lanae Boast, Judie Petit. dated 08/18/2020. Depo administered on R. UOQ tolerated well. Next Depo 01/22/2021. Reminder card given to patient.

## 2021-01-22 ENCOUNTER — Other Ambulatory Visit: Payer: Self-pay

## 2021-01-22 ENCOUNTER — Ambulatory Visit (LOCAL_COMMUNITY_HEALTH_CENTER): Payer: Self-pay

## 2021-01-22 VITALS — BP 136/80 | Ht 65.0 in | Wt 193.0 lb

## 2021-01-22 DIAGNOSIS — Z30013 Encounter for initial prescription of injectable contraceptive: Secondary | ICD-10-CM

## 2021-01-22 DIAGNOSIS — Z3009 Encounter for other general counseling and advice on contraception: Secondary | ICD-10-CM

## 2021-01-22 DIAGNOSIS — Z3042 Encounter for surveillance of injectable contraceptive: Secondary | ICD-10-CM

## 2021-01-22 NOTE — Progress Notes (Signed)
11 weeks 0 days post depo. Voices no concerns.  Depo given per order by Elveria Rising dated 08/18/2020. Tolerated well LUOQ. Next depo due 04/09/2021, pt aware. Jerel Shepherd, RN

## 2021-04-28 ENCOUNTER — Other Ambulatory Visit: Payer: Self-pay

## 2021-04-28 ENCOUNTER — Ambulatory Visit (LOCAL_COMMUNITY_HEALTH_CENTER): Payer: Self-pay

## 2021-04-28 VITALS — BP 141/85 | Ht 65.0 in | Wt 193.5 lb

## 2021-04-28 DIAGNOSIS — Z30013 Encounter for initial prescription of injectable contraceptive: Secondary | ICD-10-CM

## 2021-04-28 DIAGNOSIS — Z3009 Encounter for other general counseling and advice on contraception: Secondary | ICD-10-CM

## 2021-04-28 DIAGNOSIS — Z3042 Encounter for surveillance of injectable contraceptive: Secondary | ICD-10-CM

## 2021-04-28 NOTE — Progress Notes (Signed)
13 weeks 5 days post depo. BP elevated at 141/85 and 10 min later 144/97. Pt relates BP elevation today to her stressful job. RN counseled pt to check BP periodically at local pharmacy and to have PCP evaluate. Consult Elveria Rising, FNP who gives ok for depo today based on order dated 08/18/2020. Depo given today RUOQ, tolerated well.  Next depo due 07/14/21, has reminder card. Jerel Shepherd, RN

## 2021-05-04 NOTE — Progress Notes (Signed)
Consulted by RN re: patient situation.  Reviewed RN note and agree that it reflects our discussion and my recommendations.  ° ° °Sreeja Spies, FNP  °

## 2021-06-09 DIAGNOSIS — R0981 Nasal congestion: Secondary | ICD-10-CM | POA: Diagnosis not present

## 2021-06-09 DIAGNOSIS — Z20822 Contact with and (suspected) exposure to covid-19: Secondary | ICD-10-CM | POA: Diagnosis not present

## 2021-06-09 DIAGNOSIS — Z03818 Encounter for observation for suspected exposure to other biological agents ruled out: Secondary | ICD-10-CM | POA: Diagnosis not present

## 2021-06-09 DIAGNOSIS — J0140 Acute pansinusitis, unspecified: Secondary | ICD-10-CM | POA: Diagnosis not present

## 2021-07-16 ENCOUNTER — Ambulatory Visit (LOCAL_COMMUNITY_HEALTH_CENTER): Payer: BC Managed Care – PPO

## 2021-07-16 ENCOUNTER — Other Ambulatory Visit: Payer: Self-pay

## 2021-07-16 VITALS — BP 139/88 | Ht 65.0 in | Wt 193.0 lb

## 2021-07-16 DIAGNOSIS — Z3009 Encounter for other general counseling and advice on contraception: Secondary | ICD-10-CM

## 2021-07-16 DIAGNOSIS — Z3042 Encounter for surveillance of injectable contraceptive: Secondary | ICD-10-CM

## 2021-07-16 MED ORDER — MEDROXYPROGESTERONE ACETATE 150 MG/ML IM SUSP
150.0000 mg | Freq: Once | INTRAMUSCULAR | Status: AC
  Administered 2021-07-16: 150 mg via INTRAMUSCULAR

## 2021-07-16 NOTE — Progress Notes (Signed)
11 weeks 2 days post depo. Voices no concerns. BP today 139/88. Pt states she is in process of establishing PCP in [place] area as she recently moved there. Pt has had elevated BP in past at [facility] visits. Advised pt to establish PCP and maintain regular f-u. ? ?Depo given today per SO Dr Karyl Kinnier. Tolerated well L UOQ. Pt is due for RP 08/19/2021 and depo is due 10/01/2021. Pt prefers to have both RP and depo at same visit. Reminder given. Jerel Shepherd, RN ? ?

## 2021-10-01 ENCOUNTER — Ambulatory Visit (LOCAL_COMMUNITY_HEALTH_CENTER): Payer: Self-pay | Admitting: Family Medicine

## 2021-10-01 ENCOUNTER — Encounter: Payer: Self-pay | Admitting: Family Medicine

## 2021-10-01 VITALS — BP 138/90 | Ht 65.0 in | Wt 184.0 lb

## 2021-10-01 DIAGNOSIS — Z3042 Encounter for surveillance of injectable contraceptive: Secondary | ICD-10-CM

## 2021-10-01 DIAGNOSIS — Z0000 Encounter for general adult medical examination without abnormal findings: Secondary | ICD-10-CM

## 2021-10-01 DIAGNOSIS — Z113 Encounter for screening for infections with a predominantly sexual mode of transmission: Secondary | ICD-10-CM

## 2021-10-01 DIAGNOSIS — Z3009 Encounter for other general counseling and advice on contraception: Secondary | ICD-10-CM

## 2021-10-01 LAB — WET PREP FOR TRICH, YEAST, CLUE
Trichomonas Exam: NEGATIVE
Yeast Exam: NEGATIVE

## 2021-10-01 LAB — HM HEPATITIS C SCREENING LAB: HM Hepatitis Screen: NEGATIVE

## 2021-10-01 LAB — HM HIV SCREENING LAB: HM HIV Screening: NEGATIVE

## 2021-10-01 MED ORDER — MEDROXYPROGESTERONE ACETATE 150 MG/ML IM SUSP
150.0000 mg | INTRAMUSCULAR | Status: AC
  Administered 2021-10-01 – 2022-07-05 (×4): 150 mg via INTRAMUSCULAR

## 2021-10-01 NOTE — Progress Notes (Signed)
Patient seen for PE, depo and std screening. Depo given in L hip. Depo reminder card given. PCP list given. Condoms declined.

## 2021-10-01 NOTE — Progress Notes (Signed)
[facility] DEPARTMENT [facility] [address] Main Number: [phone]  Family Planning Visit- Repeat Yearly Visit  Subjective:  Sherri [name] is a 31 y.o. G0P0000  being seen today for an annual wellness visit and to discuss contraception options.   The patient is currently using Hormonal Injection for pregnancy prevention. Patient does not want a pregnancy in the next year.    report they are looking for a method that provides Minimal bleeding/improved bleeding profile, Discrete method, Ready when they are , Long term method, and Methods that does not involve too much memory   Patient has the following medical problems: has Obesity BMI=31.8; Marijuana use; and Hx of LEEP (loop electrosurgical excision procedure) 2015 [facility] on their problem list.  Chief Complaint  Patient presents with   Annual Exam   Contraception    Patient reports here for physical and continue Depo as [facility]   Patient denies any other concerns    See flowsheet for other program required questions.   Body mass index is 30.62 kg/m. - Patient is eligible for diabetes screening based on BMI and age >19?  not applicable HA1C ordered? no  Patient reports 1 of partners in last year. Desires STI screening?  Yes   Has patient been screened once for HCV in the past?  No  No results found for: HCVAB  Does the patient have current of drug use, have a partner with drug use, and/or has been incarcerated since last result? No  If yes-- Screen for HCV through [facility]   Does the patient meet criteria for HBV testing? No  Criteria:  -Household, sexual or needle sharing contact with HBV -History of drug use -HIV positive -Those with known Hep C   Health Maintenance Due  Topic Date Due   COVID-19 Vaccine (1) Never done   Hepatitis C Screening  Never done   TETANUS/TDAP  05/14/2019    ROS  The following portions of the patient's history were reviewed and updated as  appropriate: allergies, current medications, past family history, past medical history, past social history, past surgical history and problem list. Problem list updated.  Objective:   Vitals:   10/01/21 0853 10/01/21 0855  BP: 138/85 138/90  Weight: 184 lb (83.5 kg)   Height: 5\' 5"  (1.651 m)     Physical Exam Vitals and nursing note reviewed.  Constitutional:      Appearance: Normal appearance.  HENT:     Head: Normocephalic and atraumatic.     Mouth/Throat:     Mouth: Mucous membranes are moist.     Dentition: Normal dentition. No dental caries.     Pharynx: No oropharyngeal exudate or posterior oropharyngeal erythema.  Eyes:     General: No scleral icterus. Neck:     Thyroid: No thyroid mass, thyromegaly or thyroid tenderness.  Cardiovascular:     Rate and Rhythm: Normal rate and regular rhythm.     Pulses: Normal pulses.     Heart sounds: Normal heart sounds.  Pulmonary:     Effort: Pulmonary effort is normal.     Breath sounds: Normal breath sounds.  Chest:  Breasts:    Tanner Score is 5.     Breasts are symmetrical.     Right: Normal.     Left: Normal.  Abdominal:     General: Abdomen is flat. Bowel sounds are normal.     Palpations: Abdomen is soft.  Genitourinary:    Comments: Deferred pt self  collected  Musculoskeletal:        General: Normal range of motion.     Cervical back: Normal range of motion and neck supple.  Skin:    General: Skin is warm and dry.  Neurological:     General: No focal deficit present.     Mental Status: She is alert and oriented to person, place, and time.  Psychiatric:        Mood and Affect: Mood normal.        Behavior: Behavior normal.      Assessment and Plan:  Sherri [name] is a 31 y.o. female G0P0000 presenting to the [facility] Department for an yearly wellness and contraception visit   Contraception counseling: Reviewed options based on patient desire and reproductive life plan. Patient is  interested in Hormonal Injection. This was provided to the patient today.   Risks, benefits, and typical effectiveness rates were reviewed.  Questions were answered.  Written information was also given to the patient to review.    The patient will follow up in  11-13  weeks for surveillance.  The patient was told to call with any further questions, or with any concerns about this method of contraception.  Emphasized use of condoms 100% of the time for STI prevention.  Patient was assessed for need for ECP. Patient was not  offered ECP based on current BCM.  Patient is within 4 days of unprotected sex. Patient was offered ECP. Reviewed options and patient desired No method of ECP, declined all     1. Routine general medical examination at a health care facility Well woman exam PAP due 08/2022 CBE due 08/2023  2. Screening examination for venereal disease Patient accepted all screenings including  wet prep, oral, vaginal CT/GC and bloodwork for HIV/RPR.   Wet prep results neg    NO Treatment needed  Discussed time line for [facility] results and that patient will be called with positive results and encouraged patient to call if she had not heard in 2 weeks.  Counseled to return or seek care for continued or worsening symptoms Recommended condom use with all sex  - Chlamydia/Gonorrhea [facility] - HIV/HCV [facility] - WET PREP FOR TRICH, YEAST, CLUE - Syphilis Serology, [facility] - Chlamydia/Gonorrhea [facility]  3. Encounter for surveillance of injectable contraceptive Pt desires to continue with DMPA as BCM  PT is 11 weeks since last Depo.   OK for Depo 150 mg Im every 11-13 weeks x 1 year.  - medroxyPROGESTERone (DEPO-PROVERA) injection 150 mg   Return in about 13 weeks (around 12/31/2021) for depo.  No future appointments.  Wendi Snipes, FNP

## 2021-10-13 ENCOUNTER — Emergency Department (HOSPITAL_BASED_OUTPATIENT_CLINIC_OR_DEPARTMENT_OTHER)
Admission: EM | Admit: 2021-10-13 | Discharge: 2021-10-13 | Disposition: A | Discharge status: Home / Self Care | Payer: BC Managed Care – PPO | Attending: Emergency Medicine | Admitting: Emergency Medicine

## 2021-10-13 ENCOUNTER — Encounter (HOSPITAL_BASED_OUTPATIENT_CLINIC_OR_DEPARTMENT_OTHER): Payer: Self-pay | Admitting: Emergency Medicine

## 2021-10-13 ENCOUNTER — Other Ambulatory Visit: Payer: Self-pay

## 2021-10-13 ENCOUNTER — Emergency Department (HOSPITAL_BASED_OUTPATIENT_CLINIC_OR_DEPARTMENT_OTHER): Payer: BC Managed Care – PPO

## 2021-10-13 DIAGNOSIS — R0789 Other chest pain: Secondary | ICD-10-CM | POA: Insufficient documentation

## 2021-10-13 DIAGNOSIS — R079 Chest pain, unspecified: Secondary | ICD-10-CM | POA: Diagnosis not present

## 2021-10-13 LAB — BASIC METABOLIC PANEL
Anion gap: 7 (ref 5–15)
BUN: 13 mg/dL (ref 6–20)
CO2: 23 mmol/L (ref 22–32)
Calcium: 9.5 mg/dL (ref 8.9–10.3)
Chloride: 110 mmol/L (ref 98–111)
Creatinine, Ser: 0.76 mg/dL (ref 0.44–1.00)
GFR, Estimated: >60 mL/min (ref >60)
Glucose, Bld: 108 mg/dL — ABNORMAL HIGH (ref 70–99)
Potassium: 3.3 mmol/L — ABNORMAL LOW (ref 3.5–5.1)
Sodium: 140 mmol/L (ref 135–145)

## 2021-10-13 LAB — CBC
HCT: 40.5 % (ref 36.0–46.0)
Hemoglobin: 13.6 g/dL (ref 12.0–15.0)
MCH: 30 pg (ref 26.0–34.0)
MCHC: 33.6 g/dL (ref 30.0–36.0)
MCV: 89.2 fL (ref 80.0–100.0)
Platelets: 290 10*3/uL (ref 150–400)
RBC: 4.54 MIL/uL (ref 3.87–5.11)
RDW: 13.5 % (ref 11.5–15.5)
WBC: 6.3 10*3/uL (ref 4.0–10.5)
nRBC: 0 % (ref 0.0–0.2)

## 2021-10-13 LAB — URINALYSIS, ROUTINE W REFLEX MICROSCOPIC
Bilirubin Urine: NEGATIVE
Glucose, UA: NEGATIVE mg/dL
Ketones, ur: NEGATIVE mg/dL
Leukocytes,Ua: NEGATIVE
Nitrite: NEGATIVE
Protein, ur: NEGATIVE mg/dL
Specific Gravity, Urine: >=1.03 (ref 1.005–1.030)
pH: 6 (ref 5.0–8.0)

## 2021-10-13 LAB — PREGNANCY, URINE: Preg Test, Ur: NEGATIVE

## 2021-10-13 LAB — D-DIMER, QUANTITATIVE: D-Dimer, Quant: <0.27 ug/mL-FEU (ref 0.00–0.50)

## 2021-10-13 LAB — URINALYSIS, MICROSCOPIC (REFLEX)

## 2021-10-13 LAB — TROPONIN I (HIGH SENSITIVITY): Troponin I (High Sensitivity): <2 ng/L (ref <18)

## 2021-10-13 MED ORDER — KETOROLAC TROMETHAMINE 30 MG/ML IJ SOLN
30.0000 mg | Freq: Once | INTRAMUSCULAR | Status: AC
  Administered 2021-10-13: 30 mg via INTRAVENOUS
  Filled 2021-10-13: qty 1

## 2021-10-13 NOTE — ED Provider Notes (Signed)
[facility] EMERGENCY DEPARTMENT Provider Note   CSN: [id number] Arrival date & time: 10/13/21  1002     History  Chief Complaint  Patient presents with   Chest Pain    Sherri [name] is a 31 y.o. female.  Pt is a 31 yo female with no significant pmhx.  Pt said she has been having left upper chest pain since Monday, 6/5.  Pt said it feels like a tightness.  It hurts when she takes a deep breath.  No cough or f/c.       Home Medications Prior to Admission medications   Medication Sig Start Date End Date Taking? Authorizing Provider  ciprofloxacin (CIPRO) 500 MG tablet Take 1 tablet (500 mg total) by mouth 2 (two) times daily. Patient not taking: No sig reported 06/15/15   Jene Every, MD  doxycycline (VIBRA-TABS) 100 MG tablet Take 1 tablet (100 mg total) by mouth 2 (two) times daily. Patient not taking: Reported on 11/16/2018 05/03/16   Menshew, Charlesetta Ivory, PA-C  gabapentin (NEURONTIN) 100 MG capsule Take 100 mg by mouth 3 (three) times daily. Patient not taking: Reported on 08/18/2020    [provider]  Multiple Vitamins-Minerals (MULTIVITAMIN WITH MINERALS) tablet Take 1 tablet by mouth daily. 06/05/19   Sciora, Austin Miles, CNM      Allergies    Amoxicillin, Penicillins, and Mushroom extract complex    Review of Systems   Review of Systems  Musculoskeletal:        Chest wall pain  All other systems reviewed and are negative.  Physical Exam Updated Vital Signs BP 140/66   Pulse 76   Temp 98.8 F (37.1 C) (Oral)   Resp (!) 21   Ht 5\' 4"  (1.626 m)   Wt 81.6 kg   LMP  (LMP Unknown)   SpO2 100%   BMI 30.90 kg/m  Physical Exam Vitals and nursing note reviewed.  Constitutional:      Appearance: She is well-developed.  HENT:     Head: Normocephalic and atraumatic.  Eyes:     Extraocular Movements: Extraocular movements intact.     Pupils: Pupils are equal, round, and reactive to light.  Cardiovascular:     Rate and Rhythm: Normal  rate and regular rhythm.     Heart sounds: Normal heart sounds.  Pulmonary:     Effort: Pulmonary effort is normal.     Breath sounds: Normal breath sounds.  Chest:    Abdominal:     General: Bowel sounds are normal.     Palpations: Abdomen is soft.  Musculoskeletal:        General: Normal range of motion.     Cervical back: Normal range of motion and neck supple.  Skin:    General: Skin is warm.     Capillary Refill: Capillary refill takes less than 2 seconds.  Neurological:     General: No focal deficit present.     Mental Status: She is alert and oriented to person, place, and time.  Psychiatric:        Mood and Affect: Mood normal.        Behavior: Behavior normal.    ED Results / Procedures / Treatments   Labs (all labs ordered are listed, but only abnormal results are displayed) Labs Reviewed  BASIC METABOLIC PANEL - Abnormal; Notable for the following components:      Result Value   Potassium 3.3 (*)    Glucose, Bld 108 (*)  All other components within normal limits  CBC  D-DIMER, QUANTITATIVE  PREGNANCY, URINE  URINALYSIS, ROUTINE W REFLEX MICROSCOPIC  TROPONIN I (HIGH SENSITIVITY)    EKG EKG Interpretation  Date/Time:  Wednesday October 13 2021 10:10:48 EDT Ventricular Rate:  68 PR Interval:  173 QRS Duration: 91 QT Interval:  386 QTC Calculation: 411 R Axis:   74 Text Interpretation: Sinus rhythm No significant change since last tracing Confirmed by Jacalyn Lefevre [phone]) on 10/13/2021 10:20:56 AM  Radiology DG Chest Port 1 View  Result Date: 10/13/2021 CLINICAL DATA:  Chest pain. EXAM: PORTABLE CHEST 1 VIEW COMPARISON:  March 26, 2019. FINDINGS: No consolidation. No visible pleural effusions or pneumothorax. Cardiomediastinal silhouette is consider within normal limits when accounting for AP technique. No displaced fracture. IMPRESSION: No evidence of acute cardiopulmonary disease. Electronically Signed   By: Feliberto Harts M.D.   On: 10/13/2021  10:33    Procedures Procedures    Medications Ordered in ED Medications  ketorolac (TORADOL) 30 MG/ML injection 30 mg (30 mg Intravenous Given 10/13/21 1116)    ED Course/ Medical Decision Making/ A&P                           Medical Decision Making Amount and/or Complexity of Data Reviewed Labs: ordered. Radiology: ordered.  Risk Prescription drug management.   This patient presents to the ED for concern of chest pain, this involves an extensive number of treatment options, and is a complaint that carries with it a high risk of complications and morbidity.  The differential diagnosis includes PE, CAD, pna, msk   Co morbidities that complicate the patient evaluation  none   Additional history obtained:  Additional history obtained from epic chart review   Lab Tests:  I Ordered, and personally interpreted labs.  The pertinent results include:  cbc nl, bmp, ddimer neg, troponin neg   Imaging Studies ordered:  I ordered imaging studies including cxr  I independently visualized and interpreted imaging which showed    IMPRESSION:  No evidence of acute cardiopulmonary disease.      I agree with the radiologist interpretation   Cardiac Monitoring:  The patient was maintained on a cardiac monitor.  I personally viewed and interpreted the cardiac monitored which showed an underlying rhythm of: nsr   Medicines ordered and prescription drug management:  I ordered medication including toradol  for pain  Reevaluation of the patient after these medicines showed that the patient improved I have reviewed the patients home medicines and have made adjustments as needed   Test Considered:  CTA, but ddimer neg   Critical Interventions:  ekg  Problem List / ED Course:  CP:  CP better after toradol.  Heart score of 0.  Nl EKG. DDimer neg.  I suspect pain is MSK.  Pt is stable for d/c.  She is to return if worse.  F/u with pcp.   Reevaluation:  After the  interventions noted above, I reevaluated the patient and found that they have :improved   Social Determinants of Health:  Lives at home   Dispostion:  After consideration of the diagnostic results and the patients response to treatment, I feel that the patent would benefit from discharge with outpatient f/u.          Final Clinical Impression(s) / ED Diagnoses Final diagnoses:  Atypical chest pain    Rx / DC Orders ED Discharge Orders     None  Jacalyn LefevreHaviland, Dimond Crotty, MD 10/13/21 1133

## 2021-10-13 NOTE — ED Triage Notes (Signed)
Pt reports LT chest pain since Monday; describes as tight and intermittent; denies other sxs

## 2021-10-14 ENCOUNTER — Ambulatory Visit: Payer: BC Managed Care – PPO | Admitting: Family

## 2021-10-28 ENCOUNTER — Encounter: Payer: Self-pay | Admitting: Family

## 2021-10-28 ENCOUNTER — Ambulatory Visit: Payer: BC Managed Care – PPO | Admitting: Family

## 2021-10-28 VITALS — BP 142/102 | HR 68 | Temp 97.8°F | Ht 65.0 in | Wt 191.2 lb

## 2021-10-28 DIAGNOSIS — F32A Depression, unspecified: Secondary | ICD-10-CM | POA: Insufficient documentation

## 2021-10-28 DIAGNOSIS — F419 Anxiety disorder, unspecified: Secondary | ICD-10-CM | POA: Diagnosis not present

## 2021-10-28 DIAGNOSIS — I10 Essential (primary) hypertension: Secondary | ICD-10-CM | POA: Insufficient documentation

## 2021-10-28 DIAGNOSIS — Z23 Encounter for immunization: Secondary | ICD-10-CM

## 2021-10-28 DIAGNOSIS — E669 Obesity, unspecified: Secondary | ICD-10-CM | POA: Diagnosis not present

## 2021-10-28 HISTORY — DX: Essential (primary) hypertension: I10

## 2021-10-28 MED ORDER — AMLODIPINE BESYLATE 5 MG PO TABS: 5.0000 mg | ORAL_TABLET | Freq: Every day | ORAL | 0 refills | Status: DC

## 2021-10-28 MED ORDER — BUPROPION HCL ER (SR) 100 MG PO TB12: 100.0000 mg | ORAL_TABLET | Freq: Two times a day (BID) | ORAL | 0 refills | Status: DC
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
## 2021-10-28 NOTE — Patient Instructions (Signed)
Welcome to [facility], It was a pleasure meeting you today!  As discussed, I have sent your new blood pressure medicine, Amlodipine, to your pharmacy. I also have sent generic Wellbutrin for your depression & anxiety, start this in the mornings for a few days to a week, then increase to twice a day if tolerated.  I have sent a referral to our psychiatry office for counseling.  Please schedule a 1 month follow up visit today.      PLEASE NOTE: If you had any LAB tests please let us know if you have not heard back within a few days. You may see your results on MyChart before we have a chance to review them but we will give you a call once they are reviewed by [place]. If we ordered any REFERRALS today, please let us know if you have not heard from their office within the next week.  Let us know through MyChart if you are needing REFILLS, or have your pharmacy send [place] the request. You can also use MyChart to communicate with me or any office staff.

## 2021-10-28 NOTE — Assessment & Plan Note (Signed)
Chronic- Wt. Loss strategies reviewed including portion control, less carbs including sweets, eating most of calories earlier in day, drinking 64oz water qd, and establishing daily exercise routine. 

## 2021-10-28 NOTE — Assessment & Plan Note (Signed)
New, unstable, starting amlodipine, advised on use and side effects, drinking 2 L of water daily, low-sodium diet, follow-up in 1 month.
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
## 2021-11-20 ENCOUNTER — Other Ambulatory Visit: Payer: Self-pay | Admitting: Family

## 2021-11-20 DIAGNOSIS — I10 Essential (primary) hypertension: Secondary | ICD-10-CM

## 2021-11-20 DIAGNOSIS — F419 Anxiety disorder, unspecified: Secondary | ICD-10-CM

## 2021-11-26 ENCOUNTER — Ambulatory Visit: Payer: BC Managed Care – PPO | Admitting: Family

## 2021-11-26 ENCOUNTER — Encounter: Payer: Self-pay | Admitting: Family

## 2021-11-26 DIAGNOSIS — F419 Anxiety disorder, unspecified: Secondary | ICD-10-CM

## 2021-11-26 DIAGNOSIS — F32A Depression, unspecified: Secondary | ICD-10-CM | POA: Diagnosis not present

## 2021-11-26 DIAGNOSIS — I10 Essential (primary) hypertension: Secondary | ICD-10-CM | POA: Diagnosis not present

## 2021-11-26 MED ORDER — AMLODIPINE BESYLATE 2.5 MG PO TABS: 5.0000 mg | ORAL_TABLET | Freq: Two times a day (BID) | ORAL | 2 refills | Status: DC

## 2021-11-26 MED ORDER — BUPROPION HCL ER (SR) 100 MG PO TB12: 100.0000 mg | ORAL_TABLET | Freq: Two times a day (BID) | ORAL | 2 refills | Status: DC
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
## 2021-11-26 NOTE — Patient Instructions (Addendum)
It was very nice to see you today!   I sent a refill for 2.5mg  of your Amlodipine, take 1 pill twice a day and see if this decreases your hot flashes and sweats. Send me a message if it is not changing anything and I will send a different medication, but keep a low-dose of the amlodipine. A refill of been bupropion has also been sent.  Schedule a 50-month follow-up visit  Have a wonderful weekend!    PLEASE NOTE:  If you had any lab tests please let us know if you have not heard back within a few days. You may see your results on MyChart before we have a chance to review them but we will give you a call once they are reviewed by [place]. If we ordered any referrals today, please let us know if you have not heard from their office within the next week.

## 2021-11-28 NOTE — Assessment & Plan Note (Signed)
   BP better with Amlodipine 5mg   c/o severe sweating, hot flashes all day and night  advised to cut pill in half and take bid  let me know if sx not better, will switch med  f/u 3 mos or prn

## 2021-11-28 NOTE — Assessment & Plan Note (Signed)
   Chronic . doing well on Bupropion 100mg 12h bid . sending refill, f/u 3 mos or prn 

## 2021-12-05 ENCOUNTER — Other Ambulatory Visit: Payer: Self-pay | Admitting: Family

## 2021-12-05 DIAGNOSIS — I10 Essential (primary) hypertension: Secondary | ICD-10-CM

## 2021-12-20 ENCOUNTER — Other Ambulatory Visit: Payer: Self-pay | Admitting: Family

## 2021-12-20 DIAGNOSIS — F419 Anxiety disorder, unspecified: Secondary | ICD-10-CM

## 2021-12-20 DIAGNOSIS — I10 Essential (primary) hypertension: Secondary | ICD-10-CM

## 2021-12-31 ENCOUNTER — Ambulatory Visit (LOCAL_COMMUNITY_HEALTH_CENTER): Payer: BC Managed Care – PPO

## 2021-12-31 VITALS — BP 134/74 | Ht 65.0 in | Wt 186.5 lb

## 2021-12-31 DIAGNOSIS — Z3009 Encounter for other general counseling and advice on contraception: Secondary | ICD-10-CM | POA: Diagnosis not present

## 2021-12-31 DIAGNOSIS — Z3042 Encounter for surveillance of injectable contraceptive: Secondary | ICD-10-CM | POA: Diagnosis not present

## 2021-12-31 NOTE — Progress Notes (Signed)
13 weeks 0 days post depo. Voices no concerns. Recently started meds for HBP and states it is working well. BP today 134/74.  Depo administered per order by Elveria Rising, FNP dated 10/01/2021. Tolerated well RUOQ. Next depo due 03/18/2022, has reminder. Jerel Shepherd, RN

## 2022-01-14 ENCOUNTER — Ambulatory Visit (HOSPITAL_COMMUNITY): Payer: Self-pay | Admitting: Psychiatry

## 2022-01-15 ENCOUNTER — Other Ambulatory Visit: Payer: Self-pay | Admitting: Family

## 2022-01-15 DIAGNOSIS — I10 Essential (primary) hypertension: Secondary | ICD-10-CM

## 2022-01-19 ENCOUNTER — Other Ambulatory Visit: Payer: Self-pay | Admitting: Family

## 2022-01-19 DIAGNOSIS — F419 Anxiety disorder, unspecified: Secondary | ICD-10-CM

## 2022-01-20 NOTE — Telephone Encounter (Signed)
Ok to send 90d = 180 pills, 1 refill

## 2022-01-26 ENCOUNTER — Other Ambulatory Visit: Payer: Self-pay

## 2022-01-26 ENCOUNTER — Encounter (HOSPITAL_BASED_OUTPATIENT_CLINIC_OR_DEPARTMENT_OTHER): Payer: Self-pay | Admitting: Emergency Medicine

## 2022-01-26 ENCOUNTER — Emergency Department (HOSPITAL_BASED_OUTPATIENT_CLINIC_OR_DEPARTMENT_OTHER): Payer: BC Managed Care – PPO | Admitting: Radiology

## 2022-01-26 ENCOUNTER — Emergency Department (HOSPITAL_BASED_OUTPATIENT_CLINIC_OR_DEPARTMENT_OTHER)
Admission: EM | Admit: 2022-01-26 | Discharge: 2022-01-26 | Disposition: A | Discharge status: Home / Self Care | Payer: BC Managed Care – PPO | Attending: Emergency Medicine | Admitting: Emergency Medicine

## 2022-01-26 DIAGNOSIS — S62635A Displaced fracture of distal phalanx of left ring finger, initial encounter for closed fracture: Secondary | ICD-10-CM | POA: Diagnosis not present

## 2022-01-26 DIAGNOSIS — Y9372 Activity, wrestling: Secondary | ICD-10-CM | POA: Diagnosis not present

## 2022-01-26 DIAGNOSIS — I10 Essential (primary) hypertension: Secondary | ICD-10-CM | POA: Insufficient documentation

## 2022-01-26 DIAGNOSIS — S6992XA Unspecified injury of left wrist, hand and finger(s), initial encounter: Secondary | ICD-10-CM | POA: Diagnosis not present

## 2022-01-26 DIAGNOSIS — W232XXA Caught, crushed, jammed or pinched between a moving and stationary object, initial encounter: Secondary | ICD-10-CM | POA: Diagnosis not present

## 2022-01-26 DIAGNOSIS — Z79899 Other long term (current) drug therapy: Secondary | ICD-10-CM | POA: Diagnosis not present

## 2022-01-26 NOTE — ED Triage Notes (Signed)
Pt arrives to ED with c/o left 4th finger injury x1 week ago after wrestling with her nephew.

## 2022-01-26 NOTE — ED Provider Notes (Signed)
[facility] EMERGENCY DEPT Provider Note   CSN: [id number] Arrival date & time: 01/26/22  1039     History  Chief Complaint  Patient presents with   Finger Injury    Sherri [name] is a 31 y.o. female.  31 year old female presents today for evaluation of left ring finger injury that occurred about 1 week ago when she was wrestling her nephew.  She states she felt she if she jammed her finger.  She has iced the injury.  She states the swelling has not improved.  Reports minimal pain.  Pain is localized to the distal finger.  Patient is right-hand dominant.  The history is provided by the patient. No language interpreter was used.       Home Medications Prior to Admission medications   Medication Sig Start Date End Date Taking? Authorizing Provider  amLODipine (NORVASC) 2.5 MG tablet TAKE 2 TABLETS (5 MG TOTAL) BY MOUTH 2 (TWO) TIMES DAILY. 01/17/22   Dulce Sellar, NP  buPROPion ER (WELLBUTRIN SR) 100 MG 12 hr tablet TAKE 1 TABLET (100 MG TOTAL) BY MOUTH 2 (TWO) TIMES DAILY. START WITH ONE PILL IN THE MORNING FOR 3-4 DAYS, THEN INCREASE TO TWICE A DAY. 01/20/22   Dulce Sellar, NP  Multiple Vitamins-Minerals (MULTIVITAMIN WITH MINERALS) tablet Take 1 tablet by mouth daily. 06/05/19   Sciora, Austin Miles, CNM      Allergies    Amoxicillin, Penicillins, and Mushroom extract complex    Review of Systems   Review of Systems  Musculoskeletal:  Positive for arthralgias and joint swelling.  All other systems reviewed and are negative.   Physical Exam Updated Vital Signs BP (!) 140/92 (BP Location: Right Arm)   Pulse 82   Temp 97.9 F (36.6 C)   Resp 17   Ht 5\' 5"  (1.651 m)   Wt 81.6 kg   SpO2 100%   BMI 29.95 kg/m  Physical Exam Vitals and nursing note reviewed.  Constitutional:      General: She is not in acute distress.    Appearance: Normal appearance. She is not ill-appearing.  HENT:     Head: Normocephalic and atraumatic.     Nose: Nose  normal.  Eyes:     Conjunctiva/sclera: Conjunctivae normal.  Pulmonary:     Effort: Pulmonary effort is normal. No respiratory distress.  Musculoskeletal:        General: No deformity.     Comments: 2+ radial pulse present on the left.  Neurovascularly intact.  Mild swelling noted to the left distal ring index finger.  Without deformity.  No range of motion all digits of the left hand.  Flexion limited at the DIP of the left ring finger.  Without tenderness to palpation of the affected finger.  Skin:    Findings: No rash.  Neurological:     Mental Status: She is alert.     ED Results / Procedures / Treatments   Labs (all labs ordered are listed, but only abnormal results are displayed) Labs Reviewed - No data to display  EKG None  Radiology DG Finger Ring Left  Result Date: 01/26/2022 CLINICAL DATA:  Injury, pain and swelling EXAM: LEFT RING FINGER 3V COMPARISON:  None Available. FINDINGS: Obliquely oriented and mildly displaced fracture through the distal radial aspect of the middle phalanx of the ring finger, which involves the articular surface. Overlying soft tissue swelling. IMPRESSION: Mildly displaced fracture of the distal middle phalanx of the ring finger. Electronically Signed   By: 01/28/2022  Vasan M.D.   On: 01/26/2022 11:02    Procedures Procedures    Medications Ordered in ED Medications - No data to display  ED Course/ Medical Decision Making/ A&P                           Medical Decision Making Amount and/or Complexity of Data Reviewed Radiology: ordered.   Medical Decision Making / ED Course   This patient presents to the ED for concern of left ring finger injury., this involves an extensive number of treatment options, and is a complaint that carries with it a high risk of complications and morbidity.  The differential diagnosis includes tendon injury, fracture  MDM: 31 year old female presents with above-mentioned complaint.  X-ray demonstrates mildly  displaced fracture of the left distal ring finger.  Neurovascularly intact.  No angulation or significant displacement.  Patient is right-hand dominant.  Discussion had with patient regarding splinting versus buddy taping.  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  We will buddy tape fingers.  We will provide hand surgery follow-up.  Return precautions discussed.  Patient voices understanding and is in agreement with the plan.   Lab Tests: -I ordered, reviewed, and interpreted labs.   The pertinent results include:   Labs Reviewed - No data to display    EKG  EKG Interpretation  Date/Time:    Ventricular Rate:    PR Interval:    QRS Duration:   QT Interval:    QTC Calculation:   R Axis:     Text Interpretation:           Imaging Studies ordered: I ordered imaging studies including left ring finger x-ray I independently visualized and interpreted imaging. I agree with the radiologist interpretation   Medicines ordered and prescription drug management: No orders of the defined types were placed in this encounter.   -I have reviewed the patients home medicines and have made adjustments as needed  Co morbidities that complicate the patient evaluation  Past Medical History:  Diagnosis Date   ADHD    Anemia    Depression    Essential hypertension 10/28/2021   Pelvic pain in female 07/23/2020   Last Assessment & Plan:  Formatting of this note might be different from the original. -- Dyspareunia since 2015 following a LEEP procedure.  -- Describes pain as deep insertional pain. No pain ellicited with oral intercourse. No pain with BMs.  -- On exam, muscle tightness noted on left side of vagina in area of levator ani muscles. Strong sense of discomfort noted with posterior blade of speculu   Stomach ulcer    Vaginal Pap smear, abnormal    2014      Dispostion: Patient is appropriate for discharge.  Discharged in stable condition.  Hand specialist  follow-up given.  Final Clinical Impression(s) / ED Diagnoses Final diagnoses:  Closed displaced fracture of distal phalanx of left ring finger, initial encounter    Rx / DC Orders ED Discharge Orders     None         Evlyn Courier, PA-C 18/56/31 4970    Campbell Stall P, DO 26/37/85 1612

## 2022-01-26 NOTE — Discharge Instructions (Signed)
You have a minimally displaced fracture of the distal ring finger on the left.  We discussed buddy tape versus splinting.  You preferred buddy tape.  Take Tylenol and ibuprofen as you need to for pain control.  I have given you follow-up for hand specialist.  For any concerning symptoms return to the emergency room otherwise follow-up with hand specialist.

## 2022-01-28 ENCOUNTER — Other Ambulatory Visit: Payer: Self-pay | Admitting: Family

## 2022-01-28 DIAGNOSIS — I10 Essential (primary) hypertension: Secondary | ICD-10-CM

## 2022-01-31 ENCOUNTER — Encounter: Payer: Self-pay | Admitting: *Deleted

## 2022-02-01 DIAGNOSIS — S62635A Displaced fracture of distal phalanx of left ring finger, initial encounter for closed fracture: Secondary | ICD-10-CM | POA: Diagnosis not present

## 2022-02-02 ENCOUNTER — Other Ambulatory Visit: Payer: Self-pay | Admitting: Orthopedic Surgery

## 2022-02-03 ENCOUNTER — Encounter (HOSPITAL_COMMUNITY): Payer: Self-pay | Admitting: Orthopedic Surgery

## 2022-02-03 ENCOUNTER — Other Ambulatory Visit: Payer: Self-pay

## 2022-02-03 NOTE — H&P (Addendum)
History: CC / Reason for Visit: Left ring finger HPI: This patient is a 31 year old, right-hand-dominant, female who indicates that she was wrestling with her nephews when she injured her left ring finger.  She states that this happened on 01/11/2022.  She did not realize that she had broken her finger and did not have it evaluated until 01/26/2022 where x-rays were taken and she was found to have a intra-articular fracture of P2.  She was placed in splints, and referred here for further evaluation and treatment.  Past medical history, past surgical history, family history, social history, medications, allergies and review of systems are thoroughly reviewed by me, signed and scanned into SRS today.  The patient is otherwise healthy.  Exam:  Vitals: Refer to EMR. Constitutional:  WD, WN, NAD HEENT:  NCAT, EOMI Neuro/Psych:  Alert & oriented to person, place, and time; appropriate mood & affect Lymphatic: No generalized UE edema or lymphadenopathy Extremities / MSK:  Both UE are normal with respect to appearance, ranges of motion, joint stability, muscle strength/tone, sensation, & perfusion except as otherwise noted:  The left ring finger is swollen distally.  There is a bit of radial deviation of the DIP which nudges against the long finger slightly.  PIP and MP motion are full.  There is tenderness to palpation of the DIP, and stiffness in flexion.  Labs / Xrays:  No radiographic studies obtained today.  3 views of the leftt ring finger were evaluated on canopy and demonstrated a closed, intra-articular, fourth P2 distally at the DIP with joint incongruity in the stepdown deformity.  Assessment: Leftt ring finger P2 intra-articular fracture  Plan:  We discussed these findings at length including surgical intervention versus allowing the digit to healed closed.  The patient wishes to move forward with surgical intervention which will be closed reduction percutaneous pinning versus ORIF of the  right ring finger. The details of the operative procedure were discussed with the patient.  Questions were invited and answered.  In addition to the goal of the procedure, the risks of the procedure to include but not limited to bleeding; infection; damage to the nerves or blood vessels that could result in bleeding, numbness, weakness, chronic pain, and the need for additional procedures; stiffness; the need for revision surgery; and anesthetic risks were reviewed.  No specific outcome was guaranteed or implied.

## 2022-02-03 NOTE — Progress Notes (Signed)
PCP - Jeanie Sewer, NP Cardiologist - n/a  Chest x-ray - 10/13/21 (1V) EKG - 10/13/21 Stress Test - n/a ECHO - n/a Cardiac Cath - n/a  ICD Pacemaker/Loop - n/a  Sleep Study -  n/a CPAP - none  ERAS: Clear liquids til 9:45 AM DOS.  STOP now taking any Aspirin (unless otherwise instructed by your surgeon), Aleve, Naproxen, Ibuprofen, Motrin, Advil, Goody's, BC's, all herbal medications, fish oil, and all vitamins.   Coronavirus Screening Do you have any of the following symptoms:  Cough yes/no: No Fever (>100.61F)  yes/no: No Runny nose yes/no: No Sore throat yes/no: No Difficulty breathing/shortness of breath  yes/no: No  Have you traveled in the last 14 days and where? yes/no: No  Patient verbalized understanding of instructions that were given via phone.

## 2022-02-04 ENCOUNTER — Encounter (HOSPITAL_COMMUNITY)
Admission: RE | Disposition: A | Discharge status: Home / Self Care | Payer: Self-pay | Source: Home / Self Care | Attending: Orthopedic Surgery

## 2022-02-04 ENCOUNTER — Ambulatory Visit (HOSPITAL_COMMUNITY): Payer: BC Managed Care – PPO | Admitting: Certified Registered Nurse Anesthetist

## 2022-02-04 ENCOUNTER — Encounter (HOSPITAL_COMMUNITY): Payer: Self-pay | Admitting: Orthopedic Surgery

## 2022-02-04 ENCOUNTER — Other Ambulatory Visit: Payer: Self-pay

## 2022-02-04 ENCOUNTER — Ambulatory Visit (HOSPITAL_COMMUNITY)
Admission: RE | Admit: 2022-02-04 | Discharge: 2022-02-04 | Disposition: A | Discharge status: Home / Self Care | Payer: BC Managed Care – PPO | Attending: Orthopedic Surgery | Admitting: Orthopedic Surgery

## 2022-02-04 ENCOUNTER — Ambulatory Visit (HOSPITAL_COMMUNITY): Payer: BC Managed Care – PPO

## 2022-02-04 DIAGNOSIS — Y9372 Activity, wrestling: Secondary | ICD-10-CM | POA: Insufficient documentation

## 2022-02-04 DIAGNOSIS — Z8711 Personal history of peptic ulcer disease: Secondary | ICD-10-CM | POA: Diagnosis not present

## 2022-02-04 DIAGNOSIS — D759 Disease of blood and blood-forming organs, unspecified: Secondary | ICD-10-CM | POA: Insufficient documentation

## 2022-02-04 DIAGNOSIS — S62635A Displaced fracture of distal phalanx of left ring finger, initial encounter for closed fracture: Secondary | ICD-10-CM | POA: Diagnosis not present

## 2022-02-04 DIAGNOSIS — S62605A Fracture of unspecified phalanx of left ring finger, initial encounter for closed fracture: Secondary | ICD-10-CM | POA: Diagnosis not present

## 2022-02-04 DIAGNOSIS — F418 Other specified anxiety disorders: Secondary | ICD-10-CM | POA: Diagnosis not present

## 2022-02-04 DIAGNOSIS — D649 Anemia, unspecified: Secondary | ICD-10-CM | POA: Insufficient documentation

## 2022-02-04 DIAGNOSIS — S62617A Displaced fracture of proximal phalanx of left little finger, initial encounter for closed fracture: Secondary | ICD-10-CM | POA: Diagnosis not present

## 2022-02-04 DIAGNOSIS — I10 Essential (primary) hypertension: Secondary | ICD-10-CM | POA: Diagnosis not present

## 2022-02-04 HISTORY — DX: Anxiety disorder, unspecified: F41.9

## 2022-02-04 HISTORY — PX: OPEN REDUCTION INTERNAL FIXATION (ORIF) FINGER WITH RADIAL BONE GRAFT: SHX5666

## 2022-02-04 HISTORY — DX: Gastro-esophageal reflux disease without esophagitis: K21.9

## 2022-02-04 LAB — POCT I-STAT, CHEM 8
BUN: 12 mg/dL (ref 6–20)
Calcium, Ion: 1.21 mmol/L (ref 1.15–1.40)
Chloride: 107 mmol/L (ref 98–111)
Creatinine, Ser: 0.7 mg/dL (ref 0.44–1.00)
Glucose, Bld: 88 mg/dL (ref 70–99)
HCT: 33 % — ABNORMAL LOW (ref 36.0–46.0)
Hemoglobin: 11.2 g/dL — ABNORMAL LOW (ref 12.0–15.0)
Potassium: 3.3 mmol/L — ABNORMAL LOW (ref 3.5–5.1)
Sodium: 142 mmol/L (ref 135–145)
TCO2: 21 mmol/L — ABNORMAL LOW (ref 22–32)

## 2022-02-04 LAB — CBC
HCT: 34.4 % — ABNORMAL LOW (ref 36.0–46.0)
Hemoglobin: 11.5 g/dL — ABNORMAL LOW (ref 12.0–15.0)
MCH: 30.7 pg (ref 26.0–34.0)
MCHC: 33.4 g/dL (ref 30.0–36.0)
MCV: 92 fL (ref 80.0–100.0)
Platelets: 308 10*3/uL (ref 150–400)
RBC: 3.74 MIL/uL — ABNORMAL LOW (ref 3.87–5.11)
RDW: 14.1 % (ref 11.5–15.5)
WBC: 6.8 10*3/uL (ref 4.0–10.5)
nRBC: 0 % (ref 0.0–0.2)

## 2022-02-04 LAB — POCT PREGNANCY, URINE: Preg Test, Ur: NEGATIVE

## 2022-02-04 SURGERY — OPEN REDUCTION INTERNAL FIXATION (ORIF) FINGER WITH RADIAL BONE GRAFT
Anesthesia: Monitor Anesthesia Care | Site: Finger | Laterality: Left

## 2022-02-04 MED ORDER — ORAL CARE MOUTH RINSE: 15.0000 mL | Freq: Once | OROMUCOSAL | Status: AC

## 2022-02-04 MED ORDER — BUPIVACAINE HCL (PF) 0.25 % IJ SOLN
INTRAMUSCULAR | Status: AC
  Filled 2022-02-04: qty 30

## 2022-02-04 MED ORDER — FENTANYL CITRATE (PF) 250 MCG/5ML IJ SOLN
INTRAMUSCULAR | Status: DC | PRN
  Administered 2022-02-04 (×3): 50 ug via INTRAVENOUS

## 2022-02-04 MED ORDER — VANCOMYCIN HCL IN DEXTROSE 1-5 GM/200ML-% IV SOLN
1000.0000 mg | INTRAVENOUS | Status: AC
  Administered 2022-02-04: 1000 mg via INTRAVENOUS
  Filled 2022-02-04: qty 200

## 2022-02-04 MED ORDER — DIPHENHYDRAMINE HCL 50 MG/ML IJ SOLN: 25.0000 mg | Freq: Once | INTRAMUSCULAR | Status: AC

## 2022-02-04 MED ORDER — MIDAZOLAM HCL 2 MG/2ML IJ SOLN
INTRAMUSCULAR | Status: DC | PRN
  Administered 2022-02-04: 2 mg via INTRAVENOUS

## 2022-02-04 MED ORDER — PROPOFOL 10 MG/ML IV BOLUS
INTRAVENOUS | Status: AC
  Filled 2022-02-04: qty 20

## 2022-02-04 MED ORDER — DIPHENHYDRAMINE HCL 50 MG/ML IJ SOLN
INTRAMUSCULAR | Status: AC
  Administered 2022-02-04: 25 mg via INTRAVENOUS
  Filled 2022-02-04: qty 1

## 2022-02-04 MED ORDER — OXYCODONE HCL 5 MG PO TABS: 5.0000 mg | ORAL_TABLET | Freq: Four times a day (QID) | ORAL | 0 refills | Status: DC | PRN

## 2022-02-04 MED ORDER — CHLORHEXIDINE GLUCONATE 0.12 % MT SOLN
15.0000 mL | Freq: Once | OROMUCOSAL | Status: AC
  Administered 2022-02-04: 15 mL via OROMUCOSAL
  Filled 2022-02-04: qty 15

## 2022-02-04 MED ORDER — FENTANYL CITRATE (PF) 250 MCG/5ML IJ SOLN
INTRAMUSCULAR | Status: AC
  Filled 2022-02-04: qty 5

## 2022-02-04 MED ORDER — DEXMEDETOMIDINE HCL IN NACL 80 MCG/20ML IV SOLN
INTRAVENOUS | Status: DC | PRN
  Administered 2022-02-04: 8 ug via BUCCAL
  Administered 2022-02-04: 4 ug via BUCCAL

## 2022-02-04 MED ORDER — FENTANYL CITRATE (PF) 100 MCG/2ML IJ SOLN: 25.0000 ug | INTRAMUSCULAR | Status: DC | PRN

## 2022-02-04 MED ORDER — LIDOCAINE HCL (PF) 1 % IJ SOLN
INTRAMUSCULAR | Status: AC
  Filled 2022-02-04: qty 30

## 2022-02-04 MED ORDER — PROPOFOL 500 MG/50ML IV EMUL
INTRAVENOUS | Status: DC | PRN
  Administered 2022-02-04: 75 ug/kg/min via INTRAVENOUS

## 2022-02-04 MED ORDER — ACETAMINOPHEN 10 MG/ML IV SOLN: 1000.0000 mg | Freq: Once | INTRAVENOUS | Status: DC | PRN

## 2022-02-04 MED ORDER — MIDAZOLAM HCL 2 MG/2ML IJ SOLN
INTRAMUSCULAR | Status: AC
  Filled 2022-02-04: qty 2

## 2022-02-04 MED ORDER — BUPIVACAINE HCL (PF) 0.25 % IJ SOLN
INTRAMUSCULAR | Status: DC | PRN
  Administered 2022-02-04: 5 mL

## 2022-02-04 MED ORDER — 0.9 % SODIUM CHLORIDE (POUR BTL) OPTIME
TOPICAL | Status: DC | PRN
  Administered 2022-02-04: 500 mL

## 2022-02-04 MED ORDER — PROPOFOL 10 MG/ML IV BOLUS
INTRAVENOUS | Status: DC | PRN
  Administered 2022-02-04 (×2): 25 mg via INTRAVENOUS

## 2022-02-04 MED ORDER — LIDOCAINE 2% (20 MG/ML) 5 ML SYRINGE
INTRAMUSCULAR | Status: DC | PRN
  Administered 2022-02-04: 40 mg via INTRAVENOUS

## 2022-02-04 MED ORDER — PROPOFOL 1000 MG/100ML IV EMUL
INTRAVENOUS | Status: AC
  Filled 2022-02-04: qty 100

## 2022-02-04 MED ORDER — LIDOCAINE HCL (PF) 1 % IJ SOLN
INTRAMUSCULAR | Status: DC | PRN
  Administered 2022-02-04: 5 mL

## 2022-02-04 MED ORDER — LACTATED RINGERS IV SOLN: INTRAVENOUS | Status: DC

## 2022-02-04 MED ORDER — ACETAMINOPHEN 325 MG PO TABS: 650.0000 mg | ORAL_TABLET | Freq: Four times a day (QID) | ORAL | Status: DC

## 2022-02-04 SURGICAL SUPPLY — 56 items
APL PRP STRL LF DISP 70% ISPRP (MISCELLANEOUS) ×1
BAG COUNTER SPONGE SURGICOUNT (BAG) ×2 IMPLANT
BAG SPNG CNTER NS LX DISP (BAG) ×1
BAND INSRT 18 STRL LF DISP RB (MISCELLANEOUS)
BAND RUBBER #18 3X1/16 STRL (MISCELLANEOUS) IMPLANT
BNDG CMPR 75X21 PLY HI ABS (MISCELLANEOUS) ×1
BNDG CMPR 75X41 PLY HI ABS (GAUZE/BANDAGES/DRESSINGS) ×1
BNDG CMPR 9X4 STRL LF SNTH (GAUZE/BANDAGES/DRESSINGS)
BNDG COHESIVE 1X5 TAN STRL LF (GAUZE/BANDAGES/DRESSINGS) IMPLANT
BNDG COHESIVE 2X5 TAN STRL LF (GAUZE/BANDAGES/DRESSINGS) IMPLANT
BNDG COHESIVE 4X5 TAN STRL (GAUZE/BANDAGES/DRESSINGS) IMPLANT
BNDG CONFORM 3 STRL LF (GAUZE/BANDAGES/DRESSINGS) IMPLANT
BNDG ESMARK 4X9 LF (GAUZE/BANDAGES/DRESSINGS) IMPLANT
BNDG GAUZE DERMACEA FLUFF 4 (GAUZE/BANDAGES/DRESSINGS) ×4 IMPLANT
BNDG GZE DERMACEA 4 6PLY (GAUZE/BANDAGES/DRESSINGS) ×2
BNDG STRETCH 4X75 STRL LF (GAUZE/BANDAGES/DRESSINGS) IMPLANT
CHLORAPREP W/TINT 26 (MISCELLANEOUS) ×2 IMPLANT
CORD BIPOLAR FORCEPS 12FT (ELECTRODE) IMPLANT
COVER BACK TABLE 60X90IN (DRAPES) ×2 IMPLANT
COVER MAYO STAND STRL (DRAPES) ×2 IMPLANT
CUFF TOURN SGL QUICK 18X4 (TOURNIQUET CUFF) IMPLANT
DEPRESSOR TONGUE 6 IN STERILE (GAUZE/BANDAGES/DRESSINGS) IMPLANT
DRAPE C-ARM 42X72 X-RAY (DRAPES) ×2 IMPLANT
DRAPE HALF SHEET 40X57 (DRAPES) ×2 IMPLANT
DRAPE SURG 17X23 STRL (DRAPES) ×2 IMPLANT
DRSG EMULSION OIL 3X3 NADH (GAUZE/BANDAGES/DRESSINGS) IMPLANT
ELECT REM PT RETURN 9FT ADLT (ELECTROSURGICAL)
ELECTRODE REM PT RTRN 9FT ADLT (ELECTROSURGICAL) IMPLANT
GAUZE SPONGE 2X2 8PLY STRL LF (GAUZE/BANDAGES/DRESSINGS) IMPLANT
GAUZE SPONGE 4X4 12PLY STRL (GAUZE/BANDAGES/DRESSINGS) ×2 IMPLANT
GAUZE STRETCH 2X75IN STRL (MISCELLANEOUS) IMPLANT
GLOVE BIO SURGEON STRL SZ 6.5 (GLOVE) IMPLANT
GLOVE BIO SURGEON STRL SZ7.5 (GLOVE) ×2 IMPLANT
GLOVE BIOGEL PI IND STRL 6.5 (GLOVE) IMPLANT
GLOVE BIOGEL PI IND STRL 8 (GLOVE) ×2 IMPLANT
GOWN STRL REUS W/ TWL LRG LVL3 (GOWN DISPOSABLE) ×2 IMPLANT
GOWN STRL REUS W/ TWL XL LVL3 (GOWN DISPOSABLE) ×2 IMPLANT
GOWN STRL REUS W/TWL LRG LVL3 (GOWN DISPOSABLE) ×1
GOWN STRL REUS W/TWL XL LVL3 (GOWN DISPOSABLE) ×1
K-WIRE DBL TROCAR .045X4 (WIRE) ×1
KIT BASIN OR (CUSTOM PROCEDURE TRAY) ×2 IMPLANT
KWIRE DBL TROCAR .045X4 (WIRE) IMPLANT
NEEDLE HYPO 22GX1.5 SAFETY (NEEDLE) IMPLANT
NS IRRIG 1000ML POUR BTL (IV SOLUTION) ×2 IMPLANT
PAD CAST 4YDX4 CTTN HI CHSV (CAST SUPPLIES) IMPLANT
PADDING CAST ABS COTTON 4X4 ST (CAST SUPPLIES) IMPLANT
PADDING CAST COTTON 4X4 STRL (CAST SUPPLIES)
PADDING UNDERCAST 2X4 STRL (CAST SUPPLIES) IMPLANT
PENCIL BUTTON HOLSTER BLD 10FT (ELECTRODE) ×2 IMPLANT
SUT ETHILON 4 0 PS 2 18 (SUTURE) IMPLANT
SUT MNCRL AB 4-0 PS2 18 (SUTURE) IMPLANT
SUT VICRYL RAPIDE 4/0 PS 2 (SUTURE) IMPLANT
SYR 10ML LL (SYRINGE) IMPLANT
TOWEL GREEN STERILE (TOWEL DISPOSABLE) ×2 IMPLANT
TOWEL GREEN STERILE FF (TOWEL DISPOSABLE) ×2 IMPLANT
UNDERPAD 30X36 HEAVY ABSORB (UNDERPADS AND DIAPERS) ×2 IMPLANT

## 2022-02-04 NOTE — Anesthesia Procedure Notes (Signed)
Procedure Name: MAC Date/Time: 02/04/2022 1:45 PM  Performed by: Colin Benton, CRNAPre-anesthesia Checklist: Patient identified, Emergency Drugs available, Suction available and Patient being monitored Patient Re-evaluated:Patient Re-evaluated prior to induction Oxygen Delivery Method: Nasal cannula Induction Type: IV induction Placement Confirmation: positive ETCO2 Dental Injury: Teeth and Oropharynx as per pre-operative assessment

## 2022-02-04 NOTE — Progress Notes (Signed)
Patient complaining of itching all over body. Vancomycin infusion complete. Disconnected vancomycin from primary tubing and administered benadryl per [place].

## 2022-02-04 NOTE — Interval H&P Note (Signed)
History and Physical Interval Note:  02/04/2022 1:35 PM  Sherri [name]  has presented today for surgery, with the diagnosis of LEFT RING FINGER FRACTURE.  The various methods of treatment have been discussed with the patient and family. After consideration of risks, benefits and other options for treatment, the patient has consented to  Procedure(s) with comments: CLOSED VERSUS OPEN TREATMENT OF LEFT RING FINGER FRACTURE (Left) - LENGTH OF SURGERY: 76 MINUTES as a surgical intervention.  The patient's history has been reviewed, patient examined, no change in status, stable for surgery.  I have reviewed the patient's chart and labs.  Questions were answered to the patient's satisfaction.     Jolyn Nap

## 2022-02-04 NOTE — Anesthesia Preprocedure Evaluation (Signed)
Anesthesia Evaluation  Patient identified by MRN, date of birth, ID band Patient awake    Reviewed: Allergy & Precautions, NPO status , Patient's Chart, lab work & pertinent test results  Airway Mallampati: II  TM Distance: >3 FB Neck ROM: Full    Dental no notable dental hx.    Pulmonary neg pulmonary ROS,    Pulmonary exam normal        Cardiovascular hypertension, Pt. on medications  Rhythm:Regular Rate:Normal     Neuro/Psych Anxiety Depression negative neurological ROS     GI/Hepatic Neg liver ROS, PUD, GERD  ,  Endo/Other  negative endocrine ROS  Renal/GU negative Renal ROS  negative genitourinary   Musculoskeletal Left ring fx    Abdominal Normal abdominal exam  (+)   Peds  Hematology  (+) Blood dyscrasia, anemia ,   Anesthesia Other Findings   Reproductive/Obstetrics                             Anesthesia Physical Anesthesia Plan  ASA: 2  Anesthesia Plan: MAC   Post-op Pain Management:    Induction: Intravenous  PONV Risk Score and Plan: Propofol infusion, Ondansetron, Dexamethasone, Midazolam and Treatment may vary due to age or medical condition  Airway Management Planned: Simple Face Mask, Natural Airway and Nasal Cannula  Additional Equipment: None  Intra-op Plan:   Post-operative Plan:   Informed Consent: I have reviewed the patients History and Physical, chart, labs and discussed the procedure including the risks, benefits and alternatives for the proposed anesthesia with the patient or authorized representative who has indicated his/her understanding and acceptance.     Dental advisory given  Plan Discussed with:   Anesthesia Plan Comments:         Anesthesia Quick Evaluation

## 2022-02-04 NOTE — Op Note (Signed)
02/04/2022  1:35 PM  PATIENT:  Sherri [name]  31 y.o. female  PRE-OPERATIVE DIAGNOSIS: Displaced subacute left ring finger P2 distal intra-articular fracture  POST-OPERATIVE DIAGNOSIS:  Same  PROCEDURE:  CRPP L RF P2 fx  SURGEON: Rayvon Char. Grandville Silos, MD  PHYSICIAN ASSISTANT: None  ANESTHESIA: Digital block/MAC  SPECIMENS:  None  DRAINS:   None  EBL: Less than 10 mL  PREOPERATIVE INDICATIONS:  LEELOO SILVERTHORNE is a  31 y.o. female with an injury to the left ring finger that occurred at least 3 weeks ago, resulting in angular deformity of the finger due to displaced distal P2 fracture.  She was only recently seen in the last few days, where options were reviewed and she would like to undergo operative treatment to restore alignment and stability.  The risks benefits and alternatives were discussed with the patient preoperatively including but not limited to the risks of infection, bleeding, nerve injury, cardiopulmonary complications, the need for revision surgery, among others, and the patient verbalized understanding and consented to proceed.  OPERATIVE IMPLANTS: 0.045 inch kwires x 2  OPERATIVE PROCEDURE:  After receiving prophylactic antibiotics, the patient was escorted to the operative theatre and placed in a supine position.  A surgical "time-out" was performed during which the planned procedure, proposed operative site, and the correct patient identity were compared to the operative consent and agreement confirmed by the circulating nurse according to current facility policy.  Digital block was performed by me.  Following application of a tourniquet to the operative extremity, the exposed skin was prepped with Chloraprep and draped in the usual sterile fashion.  The tourniquet was not inflated.  Through the middle of the reduction, the fracture adequately reduced and the angulation of the digit alleviated.  Decision was made to proceed with closed reduction and skeletal  stabilization.  0.045 inch K wire was driven from radial to ulnar across the condyles middle phalanx slightly distally, with the palmar angulation.  Articular near-anatomic.  This was confirmed on both AP and lateral projections.  This K wire was then bent over 90 degrees skin and clipped.  K wire was then used longitudinally, inserted from the tip of the digit and advanced through the distal phalanx and crossing the DIP joint and into the ulnar condyle of the middle phalanx, helping to secure the alignment of the distal phalanx upon the middle phalanx of repeating stress or strain at the other point of fixation.  Alignment was judged to be near-anatomic.  This K wire was bent over the wire alongside the dorsal aspect of the nail.  Final images were obtained.  Digital dressing was applied to the thumb with with a tongue blade component volarly, allowing MP and PIP motion.  She was taken to the recovery room in stable condition.  DISPOSITION: She will be discharged home with typical instructions, returning in 10 to 15 days with new x-rays of the finger in the splint.  We will likely remain in the same splint for the first month or so before considering removing the K wires.

## 2022-02-04 NOTE — Anesthesia Postprocedure Evaluation (Signed)
Anesthesia Post Note  Patient: Sherri [name]  Procedure(s) Performed: CLOSED TREATMENT OF LEFT RING FINGER FRACTURE (Left: Finger)     Patient location during evaluation: PACU Anesthesia Type: MAC Level of consciousness: awake Pain management: pain level controlled Vital Signs Assessment: post-procedure vital signs reviewed and stable Respiratory status: spontaneous breathing Cardiovascular status: stable Postop Assessment: no apparent nausea or vomiting Anesthetic complications: no   No notable events documented.  Last Vitals:  Vitals:   02/04/22 1445 02/04/22 1500  BP: (!) 183/109 (!) 135/99  Pulse: 64 64  Resp: 16 16  Temp:  36.6 C  SpO2: 100% 100%    Last Pain:  Vitals:   02/04/22 1500  TempSrc:   PainSc: 0-No pain                 Allizon Woznick

## 2022-02-04 NOTE — Transfer of Care (Signed)
Immediate Anesthesia Transfer of Care Note  Patient: Sherri [name]  Procedure(s) Performed: CLOSED TREATMENT OF LEFT RING FINGER FRACTURE (Left: Finger)  Patient Location: PACU  Anesthesia Type:MAC combined with regional for post-op pain  Level of Consciousness: awake, alert , oriented and patient cooperative  Airway & Oxygen Therapy: Patient Spontanous Breathing and Patient connected to nasal cannula oxygen  Post-op Assessment: Report given to RN, Post -op Vital signs reviewed and stable and Patient moving all extremities X 4  Post vital signs: Reviewed and stable  Last Vitals:  Vitals Value Taken Time  BP 164/78 02/04/22 1424  Temp    Pulse 65 02/04/22 1425  Resp 17 02/04/22 1425  SpO2 100 % 02/04/22 1425  Vitals shown include unvalidated device data.  Last Pain:  Vitals:   02/04/22 1038  TempSrc:   PainSc: 0-No pain         Complications: No notable events documented.

## 2022-02-04 NOTE — Discharge Instructions (Addendum)
Discharge Instructions   You have a dressing with a splint incorporated in it. Move your fingers as much as possible, making a full fist and fully opening the fist. Elevate your hand to reduce pain & swelling of the digits.  Ice over the operative site may be helpful to reduce pain & swelling.  DO NOT USE HEAT. Pain medicine has been prescribed for you.   Leave the dressing in place until you return to our office.  You may shower, but keep the bandage clean & dry.  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  CALL THE OFFICE TODAY OR MONDAY TO ARRANGE FOR A FOLLOW-UP APPT FOR SOMETIME THE WEEK OF OCTOBER 9   Please call [phone] during normal business hours or [phone] after hours for any problems. Including the following:  - excessive redness of the incisions - drainage for more than 4 days - fever of more than 101.5 F  *Please note that pain medications will not be refilled after hours or on weekends.   WORK STATUS:  MAY RETURN TO WORK IF LIGHT DUTY IS AVAILABLE--NO LIFTING, GRIPPING, OR GRASPING WITH LEFT HAND MORE THAN 2 POUNDS, MUST KEEP SPLINT ON, CLEAN AND DRY

## 2022-02-07 ENCOUNTER — Encounter (HOSPITAL_COMMUNITY): Payer: Self-pay | Admitting: Orthopedic Surgery

## 2022-02-11 ENCOUNTER — Ambulatory Visit (HOSPITAL_COMMUNITY): Payer: Self-pay | Admitting: Psychiatry

## 2022-02-13 ENCOUNTER — Other Ambulatory Visit: Payer: Self-pay | Admitting: Family

## 2022-02-13 DIAGNOSIS — I10 Essential (primary) hypertension: Secondary | ICD-10-CM

## 2022-02-16 DIAGNOSIS — S62635D Displaced fracture of distal phalanx of left ring finger, subsequent encounter for fracture with routine healing: Secondary | ICD-10-CM | POA: Diagnosis not present

## 2022-02-25 ENCOUNTER — Ambulatory Visit: Payer: BC Managed Care – PPO | Admitting: Family

## 2022-02-28 ENCOUNTER — Encounter: Payer: Self-pay | Admitting: Family

## 2022-02-28 ENCOUNTER — Other Ambulatory Visit: Payer: Self-pay | Admitting: Family

## 2022-02-28 ENCOUNTER — Ambulatory Visit: Payer: BC Managed Care – PPO | Admitting: Family

## 2022-02-28 VITALS — BP 119/75 | HR 67 | Temp 97.5°F | Ht 64.0 in | Wt 188.8 lb

## 2022-02-28 DIAGNOSIS — E876 Hypokalemia: Secondary | ICD-10-CM

## 2022-02-28 DIAGNOSIS — Z23 Encounter for immunization: Secondary | ICD-10-CM | POA: Diagnosis not present

## 2022-02-28 DIAGNOSIS — F419 Anxiety disorder, unspecified: Secondary | ICD-10-CM

## 2022-02-28 DIAGNOSIS — I10 Essential (primary) hypertension: Secondary | ICD-10-CM

## 2022-02-28 NOTE — Assessment & Plan Note (Signed)
   chronic  BP good today, taking Amlodipine 2.5mg  bid, no more hot flashes  no refill needed today  f/u in 6 mos

## 2022-02-28 NOTE — Progress Notes (Signed)
Patient ID: Sherri [name], female    DOB: Aug 18, 1990, 31 y.o.   MRN: [id number]  Chief Complaint  Patient presents with   Hypertension    Labs for kidneys    HPI: Hypertension: Patient is currently maintained on the following medications for blood pressure: Amlodipine - causing a lot of sweating, hot flashes. Patient reports good compliance with blood pressure medications. Patient denies chest pain, headaches, shortness of breath or swelling.  Hypokalemia:  pt had surgery on her broken finger and pre-op labs indicated low potassium, advised to f/u with PCP, pt is asymptomatic.  Assessment & Plan:   Problem List Items Addressed This Visit       Cardiovascular and Mediastinum   Essential hypertension - Primary    chronic BP good today, taking Amlodipine 2.5mg  bid, no more hot flashes no refill needed today f/u in 6 mos      Relevant Orders   Basic Metabolic Panel (BMET) (Completed)   Other Visit Diagnoses     Need for vaccination       Relevant Orders   Flu Vaccine QUAD 6+ mos PF IM (Fluarix Quad PF) (Completed)   HPV 9-valent vaccine,Recombinat (Completed)   Low serum potassium       Relevant Orders   Basic Metabolic Panel (BMET) (Completed)      Subjective:    Outpatient Medications Prior to Visit  Medication Sig Dispense Refill   acetaminophen (TYLENOL) 325 MG tablet Take 2 tablets (650 mg total) by mouth every 6 (six) hours.     amLODipine (NORVASC) 2.5 MG tablet TAKE 2 TABLETS (5 MG TOTAL) BY MOUTH 2 (TWO) TIMES DAILY. 60 tablet 2   buPROPion ER (WELLBUTRIN SR) 100 MG 12 hr tablet TAKE 1 TABLET (100 MG TOTAL) BY MOUTH 2 (TWO) TIMES DAILY. START WITH ONE PILL IN THE MORNING FOR 3-4 DAYS, THEN INCREASE TO TWICE A DAY. 180 tablet 1   Multiple Vitamins-Minerals (MULTIVITAMIN WITH MINERALS) tablet Take 1 tablet by mouth daily. 100 tablet 0   oxyCODONE (ROXICODONE) 5 MG immediate release tablet Take 1 tablet (5 mg total) by mouth every 6 (six) hours as needed for  severe pain. 10 tablet 0   Facility-Administered Medications Prior to Visit  Medication Dose Route Frequency Provider Last Rate Last Admin   medroxyPROGESTERone (DEPO-PROVERA) injection 150 mg  150 mg Intramuscular Q90 days Wendi Snipes, FNP   150 mg at 12/31/21 1001   Past Medical History:  Diagnosis Date   ADHD    Anemia    Anxiety    Depression    Essential hypertension 10/28/2021   GERD (gastroesophageal reflux disease)    diet controlled   Pelvic pain in female 07/23/2020   Last Assessment & Plan:  Formatting of this note might be different from the original. -- Dyspareunia since 2015 following a LEEP procedure.  -- Describes pain as deep insertional pain. No pain ellicited with oral intercourse. No pain with BMs.  -- On exam, muscle tightness noted on left side of vagina in area of levator ani muscles. Strong sense of discomfort noted with posterior blade of speculu   Stomach ulcer    Vaginal Pap smear, abnormal    2014   Past Surgical History:  Procedure Laterality Date   ELBOW SURGERY Left    LEEP     OPEN REDUCTION INTERNAL FIXATION (ORIF) FINGER WITH RADIAL BONE GRAFT Left 02/04/2022   Procedure: CLOSED TREATMENT OF LEFT RING FINGER FRACTURE;  Surgeon: Mack Hook, MD;  Location: [facility]  OR;  Service: Orthopedics;  Laterality: Left;  LENGTH OF SURGERY: 45 MINUTES   TONSILLECTOMY     Allergies  Allergen Reactions   Amoxicillin Hives, Swelling and Rash   Penicillins Hives, Swelling and Rash   Mushroom Extract Complex Hives, Swelling and Rash    Pt allergic to MUSHROOMS.      Objective:    Physical Exam Vitals and nursing note reviewed.  Constitutional:      Appearance: Normal appearance.  Cardiovascular:     Rate and Rhythm: Normal rate and regular rhythm.  Pulmonary:     Effort: Pulmonary effort is normal.     Breath sounds: Normal breath sounds.  Musculoskeletal:        General: Normal range of motion.  Skin:    General: Skin is warm and dry.   Neurological:     Mental Status: She is alert.  Psychiatric:        Mood and Affect: Mood normal.        Behavior: Behavior normal.    BP 119/75 (BP Location: Left Arm, Patient Position: Sitting, Cuff Size: Large)   Pulse 67   Temp (!) 97.5 F (36.4 C) (Temporal)   Ht 5\' 4"  (1.626 m)   Wt 188 lb 12.8 oz (85.6 kg)   LMP  (LMP Unknown) Comment: Next injection due 03/18/22 per patient  SpO2 99%   BMI 32.41 kg/m  Wt Readings from Last 3 Encounters:  02/28/22 188 lb 12.8 oz (85.6 kg)  02/04/22 180 lb (81.6 kg)  01/26/22 180 lb (81.6 kg)       Dulce Sellar, NP

## 2022-02-28 NOTE — Patient Instructions (Signed)
It was very nice to see you today!   I will review your lab results via MyChart in a few days.  Continue taking the Amlodipine and Bupropion twice a day.  Schedule a 3 month follow up NURSE VISIT for your last HPV vaccine.  Schedule a 6 month follow up with me for refills.  Have a great week!      PLEASE NOTE:  If you had any lab tests please let us know if you have not heard back within a few days. You may see your results on MyChart before we have a chance to review them but we will give you a call once they are reviewed by [place]. If we ordered any referrals today, please let us know if you have not heard from their office within the next week.

## 2022-03-01 LAB — BASIC METABOLIC PANEL
BUN: 23 mg/dL (ref 6–23)
CO2: 26 mEq/L (ref 19–32)
Calcium: 9.6 mg/dL (ref 8.4–10.5)
Chloride: 107 mEq/L (ref 96–112)
Creatinine, Ser: 0.86 mg/dL (ref 0.40–1.20)
GFR: 90.27 mL/min (ref >60.00)
Glucose, Bld: 78 mg/dL (ref 70–99)
Potassium: 4.2 mEq/L (ref 3.5–5.1)
Sodium: 140 mEq/L (ref 135–145)

## 2022-03-03 NOTE — Assessment & Plan Note (Signed)
.   Chronic . doing well on Bupropion 100mg  12h bid . sending refill, f/u 3 mos or prn

## 2022-03-07 NOTE — Progress Notes (Signed)
Labs are all normal.

## 2022-03-10 ENCOUNTER — Ambulatory Visit: Payer: BC Managed Care – PPO | Admitting: Family

## 2022-03-10 DIAGNOSIS — S62635D Displaced fracture of distal phalanx of left ring finger, subsequent encounter for fracture with routine healing: Secondary | ICD-10-CM | POA: Diagnosis not present

## 2022-04-12 ENCOUNTER — Ambulatory Visit: Payer: BC Managed Care – PPO

## 2022-04-12 ENCOUNTER — Ambulatory Visit (LOCAL_COMMUNITY_HEALTH_CENTER): Payer: BC Managed Care – PPO

## 2022-04-12 VITALS — BP 150/80 | Ht 65.0 in | Wt 186.5 lb

## 2022-04-12 DIAGNOSIS — Z3009 Encounter for other general counseling and advice on contraception: Secondary | ICD-10-CM | POA: Diagnosis not present

## 2022-04-12 DIAGNOSIS — Z3042 Encounter for surveillance of injectable contraceptive: Secondary | ICD-10-CM | POA: Diagnosis not present

## 2022-04-12 NOTE — Progress Notes (Signed)
14 weeks 4 days post depo. BP elevated today at 150/80. Pt explains she takes HBP meds and has regular f-u with PCP in [place]. Per pt, had f-u 2 mo ago for HBP and has appt this month.   Consult Onalee Hua, FNP who gives ok for depo today based on order by Elveria Rising, FNP dated 10/01/2021.   Depo given today and tolerated well LUOQ. Counseled pt to schedule depo between 11 to 13 week intervals for optimal benefit. Pt verbalizes understanding. Next depo due 06/28/2022, has reminder. Jerel Shepherd, RN

## 2022-04-14 DIAGNOSIS — S62635D Displaced fracture of distal phalanx of left ring finger, subsequent encounter for fracture with routine healing: Secondary | ICD-10-CM | POA: Diagnosis not present

## 2022-04-14 NOTE — Progress Notes (Signed)
Consulted by RN re: patient situation.  Reviewed RN note and agree that it reflects our discussion and my recommendations. Rayshawn Maney, FNP  

## 2022-05-19 DIAGNOSIS — S62635D Displaced fracture of distal phalanx of left ring finger, subsequent encounter for fracture with routine healing: Secondary | ICD-10-CM | POA: Diagnosis not present

## 2022-05-31 ENCOUNTER — Ambulatory Visit (INDEPENDENT_AMBULATORY_CARE_PROVIDER_SITE_OTHER): Payer: BC Managed Care – PPO

## 2022-05-31 VITALS — Ht 65.0 in | Wt 186.0 lb

## 2022-05-31 DIAGNOSIS — Z23 Encounter for immunization: Secondary | ICD-10-CM | POA: Diagnosis not present

## 2022-05-31 NOTE — Progress Notes (Addendum)
Pt in office today getting 2nd HPV vaccine, pt advised no reaction to first vaccine. Advised she will need one more to complete series. Administered in pt left deltoid and pt tolerated well with no issues.

## 2022-07-05 ENCOUNTER — Ambulatory Visit (LOCAL_COMMUNITY_HEALTH_CENTER): Payer: BC Managed Care – PPO

## 2022-07-05 VITALS — BP 134/84 | Ht 65.0 in | Wt 190.0 lb

## 2022-07-05 DIAGNOSIS — Z30013 Encounter for initial prescription of injectable contraceptive: Secondary | ICD-10-CM | POA: Diagnosis not present

## 2022-07-05 DIAGNOSIS — Z3009 Encounter for other general counseling and advice on contraception: Secondary | ICD-10-CM

## 2022-07-05 DIAGNOSIS — Z308 Encounter for other contraceptive management: Secondary | ICD-10-CM

## 2022-07-05 DIAGNOSIS — Z3042 Encounter for surveillance of injectable contraceptive: Secondary | ICD-10-CM

## 2022-07-05 NOTE — Progress Notes (Signed)
12 weeks 0 days post depo. Voices no concerns. Depo given today per order by Hilario Quarry, FNP dated 10/01/2021. Tolerated well RUOQ. Next depo due 09/20/2022 and annual PE due 10/03/2022. Has reminders. Josie Saunders, RN

## 2022-07-11 DIAGNOSIS — I10 Essential (primary) hypertension: Secondary | ICD-10-CM | POA: Diagnosis not present

## 2022-07-11 DIAGNOSIS — J00 Acute nasopharyngitis [common cold]: Secondary | ICD-10-CM | POA: Diagnosis not present

## 2022-07-11 DIAGNOSIS — Z20822 Contact with and (suspected) exposure to covid-19: Secondary | ICD-10-CM | POA: Diagnosis not present

## 2022-07-11 DIAGNOSIS — Z6831 Body mass index (BMI) 31.0-31.9, adult: Secondary | ICD-10-CM | POA: Diagnosis not present

## 2022-07-11 DIAGNOSIS — Z03818 Encounter for observation for suspected exposure to other biological agents ruled out: Secondary | ICD-10-CM | POA: Diagnosis not present

## 2022-07-13 ENCOUNTER — Other Ambulatory Visit: Payer: Self-pay | Admitting: Family

## 2022-07-13 DIAGNOSIS — F419 Anxiety disorder, unspecified: Secondary | ICD-10-CM

## 2022-08-31 ENCOUNTER — Ambulatory Visit: Payer: BC Managed Care – PPO | Admitting: Family

## 2022-08-31 NOTE — Progress Notes (Deleted)
Patient ID: Sherri [name], female    DOB: 07-13-1990, 32 y.o.   MRN: [id number]  No chief complaint on file.   HPI: Hypertension: Patient is currently maintained on the following medications for blood pressure: Amlodipine - causing a lot of sweating, hot flashes. Patient reports good compliance with blood pressure medications. Patient denies chest pain, headaches, shortness of breath or swelling.   Assessment & Plan:    Assessment & Plan:  There are no diagnoses linked to this encounter.  Subjective:    Outpatient Medications Prior to Visit  Medication Sig Dispense Refill   acetaminophen (TYLENOL) 325 MG tablet Take 2 tablets (650 mg total) by mouth every 6 (six) hours.     amLODipine (NORVASC) 2.5 MG tablet TAKE 2 TABLETS (5 MG TOTAL) BY MOUTH 2 (TWO) TIMES DAILY. 60 tablet 2   buPROPion ER (WELLBUTRIN SR) 100 MG 12 hr tablet TAKE 1 TABLET (100 MG TOTAL) BY MOUTH 2 (TWO) TIMES DAILY. START WITH ONE PILL IN THE MORNING FOR 3-4 DAYS, THEN INCREASE TO TWICE A DAY. 180 tablet 1   Multiple Vitamins-Minerals (MULTIVITAMIN WITH MINERALS) tablet Take 1 tablet by mouth daily. 100 tablet 0   No facility-administered medications prior to visit.   Past Medical History:  Diagnosis Date   ADHD    Anemia    Anxiety    Depression    Essential hypertension 10/28/2021   GERD (gastroesophageal reflux disease)    diet controlled   Pelvic pain in female 07/23/2020   Last Assessment & Plan:  Formatting of this note might be different from the original. -- Dyspareunia since 2015 following a LEEP procedure.  -- Describes pain as deep insertional pain. No pain ellicited with oral intercourse. No pain with BMs.  -- On exam, muscle tightness noted on left side of vagina in area of levator ani muscles. Strong sense of discomfort noted with posterior blade of speculu   Stomach ulcer    Vaginal Pap smear, abnormal    2014   Past Surgical History:  Procedure Laterality Date   ELBOW SURGERY Left     LEEP     OPEN REDUCTION INTERNAL FIXATION (ORIF) FINGER WITH RADIAL BONE GRAFT Left 02/04/2022   Procedure: CLOSED TREATMENT OF LEFT RING FINGER FRACTURE;  Surgeon: Mack Hook, MD;  Location: [facility] OR;  Service: Orthopedics;  Laterality: Left;  LENGTH OF SURGERY: 45 MINUTES   TONSILLECTOMY     Allergies  Allergen Reactions   Amoxicillin Hives, Swelling and Rash   Penicillins Hives, Swelling and Rash   Mushroom Extract Complex Hives, Swelling and Rash    Pt allergic to MUSHROOMS.      Objective:    Physical Exam Vitals and nursing note reviewed.  Constitutional:      Appearance: Normal appearance.  Cardiovascular:     Rate and Rhythm: Normal rate and regular rhythm.  Pulmonary:     Effort: Pulmonary effort is normal.     Breath sounds: Normal breath sounds.  Musculoskeletal:        General: Normal range of motion.  Skin:    General: Skin is warm and dry.  Neurological:     Mental Status: She is alert.  Psychiatric:        Mood and Affect: Mood normal.        Behavior: Behavior normal.    There were no vitals taken for this visit. Wt Readings from Last 3 Encounters:  09/01/22 191 lb 3.2 oz (86.7 kg)  07/05/22 190 lb (  86.2 kg)  06/02/22 186 lb (84.4 kg)       Dulce Sellar, NP

## 2022-09-01 ENCOUNTER — Encounter: Payer: Self-pay | Admitting: Family

## 2022-09-01 ENCOUNTER — Ambulatory Visit: Payer: BC Managed Care – PPO | Admitting: Family

## 2022-09-01 DIAGNOSIS — F32A Depression, unspecified: Secondary | ICD-10-CM | POA: Diagnosis not present

## 2022-09-01 DIAGNOSIS — F419 Anxiety disorder, unspecified: Secondary | ICD-10-CM | POA: Diagnosis not present

## 2022-09-01 DIAGNOSIS — I10 Essential (primary) hypertension: Secondary | ICD-10-CM | POA: Diagnosis not present

## 2022-09-01 MED ORDER — PROPRANOLOL HCL 10 MG PO TABS: 10.0000 mg | ORAL_TABLET | Freq: Two times a day (BID) | ORAL | 2 refills | Status: DC | PRN

## 2022-09-01 MED ORDER — AMLODIPINE BESYLATE 2.5 MG PO TABS: 5.0000 mg | ORAL_TABLET | Freq: Two times a day (BID) | ORAL | 1 refills | Status: DC

## 2022-09-01 MED ORDER — BUPROPION HCL ER (SR) 150 MG PO TB12: 150.0000 mg | ORAL_TABLET | Freq: Two times a day (BID) | ORAL | 5 refills | Status: DC

## 2022-09-01 NOTE — Patient Instructions (Signed)
It was very nice to see you today!   I sent a refill of your Bupropion with the increased dose to  twice a day. Let me know in a few weeks how this is working for you and controlling your emotions but not increasing your anxiety.  Remember your 2nd dose of Amlodipine! Take with the 2nd Bupropion.  I also sent Propranolol to take as needed for when you feel your heart rate going up or the palpitations in your chest.  Move your May HPV vaccine to June and schedule with a physical & fasting labs.     PLEASE NOTE:  If you had any lab tests please let us know if you have not heard back within a few days. You may see your results on MyChart before we have a chance to review them but we will give you a call once they are reviewed by [place]. If we ordered any referrals today, please let us know if you have not heard from their office within the next week.

## 2022-09-01 NOTE — Assessment & Plan Note (Signed)
chronic BP a little up today, taking Amlodipine 2.5mg  bid, but forgets 2nd dose often, remembers Bupropion so advised she take these together, has to remember to take to work with her also having more stress, BP at home 150/90, most readings 130-140 sending refill, may need to add med f/u in 1 mo - June

## 2022-09-01 NOTE — Assessment & Plan Note (Signed)
Chronic on Bupropion  12h bid sending refill, f/u 3 mos or prn reports increased stress at work & home increasing dose to  bid advised to let me know via message if working or making her anxiety worse also sending Propranolol  bid prn for mild panic attacks at work, may also help prevent her headaches f/u 1 month

## 2022-09-01 NOTE — Progress Notes (Signed)
Patient ID: Sherri [name], female    DOB: 16-Feb-1991, 32 y.o.   MRN: [id number]  Chief Complaint  Patient presents with   Hypertension   Medical Management of Chronic Issues   Anxiety    Has started to have migraines when she gets anxious  Scored 3's on all GAD-7 questions    HPI: Hypertension: Patient is currently maintained on the following medications for blood pressure: Amlodipine - causing a lot of sweating, hot flashes. Taking 2.5mg  bid. Having headaches, sometimes wakes up with it, sometimes other times.  Patient reports good compliance with blood pressure medications. Patient denies chest pain, shortness of breath or swelling. Anxiety/Depression: Patient complains of anxiety disorder.   She has the following symptoms: chest pain, difficulty concentrating, feelings of losing control, irritable, palpitations, racing thoughts, shortness of breath, sweating.  Onset of symptoms was approximately  years ago, She denies current suicidal and homicidal ideation. Risk factors: negative life event termination of long term relationship x 2.  Previous treatment includes nothing. Bupropion, doing ok, tolerating, but having increased stress at her work and now having to care for her mom as well, she is feeling overwhelmed.  Assessment & Plan:  Essential hypertension Assessment & Plan: chronic BP a little up today, taking Amlodipine 2.5mg  bid, but forgets 2nd dose often, remembers Bupropion so advised she take these together, has to remember to take to work with her also having more stress, BP at home 150/90, most readings 130-140 sending refill, may need to add med f/u in 1 mo - June  Orders: -     amLODIPine Besylate; Take 2 tablets (5 mg total) by mouth 2 (two) times daily.  Dispense: 180 tablet; Refill: 1  Anxiety and depression Assessment & Plan: Chronic on Bupropion  12h bid sending refill, f/u 3 mos or prn reports increased stress at work & home increasing dose to   bid advised to let me know via message if working or making her anxiety worse also sending Propranolol  bid prn for mild panic attacks at work, may also help prevent her headaches f/u 1 month  Orders: -     buPROPion HCl ER (SR); Take 1 tablet (150 mg total) by mouth 2 (two) times daily.  Dispense: 60 tablet; Refill: 5 -     Propranolol HCl; Take 1 tablet (10 mg total) by mouth 2 (two) times daily as needed (Anxiety, headache prevention).  Dispense: 60 tablet; Refill: 2    Subjective:    Outpatient Medications Prior to Visit  Medication Sig Dispense Refill   acetaminophen (TYLENOL) 325 MG tablet Take 2 tablets (650 mg total) by mouth every 6 (six) hours.     Multiple Vitamins-Minerals (MULTIVITAMIN WITH MINERALS) tablet Take 1 tablet by mouth daily. 100 tablet 0   amLODipine (NORVASC) 2.5 MG tablet TAKE 2 TABLETS (5 MG TOTAL) BY MOUTH 2 (TWO) TIMES DAILY. 60 tablet 2   buPROPion ER (WELLBUTRIN SR) 100 MG 12 hr tablet TAKE 1 TABLET (100 MG TOTAL) BY MOUTH 2 (TWO) TIMES DAILY. START WITH ONE PILL IN THE MORNING FOR 3-4 DAYS, THEN INCREASE TO TWICE A DAY. 180 tablet 1   oxyCODONE (ROXICODONE) 5 MG immediate release tablet Take 1 tablet (5 mg total) by mouth every 6 (six) hours as needed for severe pain. 10 tablet 0   No facility-administered medications prior to visit.   Past Medical History:  Diagnosis Date   ADHD    Anemia    Anxiety    Depression  Essential hypertension 10/28/2021   GERD (gastroesophageal reflux disease)    diet controlled   Pelvic pain in female 07/23/2020   Last Assessment & Plan:  Formatting of this note might be different from the original. -- Dyspareunia since 2015 following a LEEP procedure.  -- Describes pain as deep insertional pain. No pain ellicited with oral intercourse. No pain with BMs.  -- On exam, muscle tightness noted on left side of vagina in area of levator ani muscles. Strong sense of discomfort noted with posterior blade of speculu    Stomach ulcer    Vaginal Pap smear, abnormal    2014   Past Surgical History:  Procedure Laterality Date   ELBOW SURGERY Left    LEEP     OPEN REDUCTION INTERNAL FIXATION (ORIF) FINGER WITH RADIAL BONE GRAFT Left 02/04/2022   Procedure: CLOSED TREATMENT OF LEFT RING FINGER FRACTURE;  Surgeon: Mack Hook, MD;  Location: [facility] OR;  Service: Orthopedics;  Laterality: Left;  LENGTH OF SURGERY: 45 MINUTES   TONSILLECTOMY     Allergies  Allergen Reactions   Amoxicillin Hives, Swelling and Rash   Penicillins Hives, Swelling and Rash   Mushroom Extract Complex Hives, Swelling and Rash    Pt allergic to MUSHROOMS.      Objective:    Physical Exam Vitals and nursing note reviewed.  Constitutional:      Appearance: Normal appearance.  Cardiovascular:     Rate and Rhythm: Normal rate and regular rhythm.  Pulmonary:     Effort: Pulmonary effort is normal.     Breath sounds: Normal breath sounds.  Musculoskeletal:        General: Normal range of motion.  Skin:    General: Skin is warm and dry.  Neurological:     Mental Status: She is alert.  Psychiatric:        Mood and Affect: Mood normal.        Behavior: Behavior normal.    BP (!) 136/90   Pulse 88   Temp 97.7 F (36.5 C) (Temporal)   Resp 16   Ht  (1.651 m)   Wt 191 lb 3.2 oz (86.7 kg)   SpO2 98%   BMI 31.82 kg/m  Wt Readings from Last 3 Encounters:  09/01/22 191 lb 3.2 oz (86.7 kg)  07/05/22 190 lb (86.2 kg)  06/02/22 186 lb (84.4 kg)      Dulce Sellar, NP

## 2022-09-15 ENCOUNTER — Other Ambulatory Visit: Payer: Self-pay | Admitting: Family

## 2022-09-15 DIAGNOSIS — F32A Depression, unspecified: Secondary | ICD-10-CM

## 2022-09-25 ENCOUNTER — Other Ambulatory Visit: Payer: Self-pay | Admitting: Family

## 2022-09-25 DIAGNOSIS — F32A Depression, unspecified: Secondary | ICD-10-CM

## 2022-09-25 DIAGNOSIS — I10 Essential (primary) hypertension: Secondary | ICD-10-CM

## 2022-09-28 ENCOUNTER — Ambulatory Visit: Payer: BC Managed Care – PPO

## 2022-10-03 ENCOUNTER — Encounter: Payer: Self-pay | Admitting: Family

## 2022-10-04 ENCOUNTER — Other Ambulatory Visit: Payer: Self-pay | Admitting: Physician Assistant

## 2022-10-04 MED ORDER — BUPROPION HCL ER (SR) 100 MG PO TB12: 100.0000 mg | ORAL_TABLET | Freq: Two times a day (BID) | ORAL | 0 refills | Status: DC

## 2022-10-21 ENCOUNTER — Ambulatory Visit (LOCAL_COMMUNITY_HEALTH_CENTER): Payer: BC Managed Care – PPO | Admitting: Advanced Practice Midwife

## 2022-10-21 VITALS — BP 129/93 | HR 63 | Ht 65.0 in | Wt 191.8 lb

## 2022-10-21 DIAGNOSIS — D069 Carcinoma in situ of cervix, unspecified: Secondary | ICD-10-CM

## 2022-10-21 DIAGNOSIS — Z3042 Encounter for surveillance of injectable contraceptive: Secondary | ICD-10-CM

## 2022-10-21 DIAGNOSIS — Z3009 Encounter for other general counseling and advice on contraception: Secondary | ICD-10-CM

## 2022-10-21 DIAGNOSIS — F419 Anxiety disorder, unspecified: Secondary | ICD-10-CM

## 2022-10-21 DIAGNOSIS — Z308 Encounter for other contraceptive management: Secondary | ICD-10-CM

## 2022-10-21 DIAGNOSIS — I10 Essential (primary) hypertension: Secondary | ICD-10-CM

## 2022-10-21 DIAGNOSIS — Z30013 Encounter for initial prescription of injectable contraceptive: Secondary | ICD-10-CM | POA: Diagnosis not present

## 2022-10-21 DIAGNOSIS — Z720 Tobacco use: Secondary | ICD-10-CM

## 2022-10-21 LAB — WET PREP FOR TRICH, YEAST, CLUE
Trichomonas Exam: NEGATIVE
Yeast Exam: NEGATIVE

## 2022-10-21 LAB — HM HEPATITIS C SCREENING LAB: HM Hepatitis Screen: NEGATIVE

## 2022-10-21 LAB — HM HIV SCREENING LAB: HM HIV Screening: NEGATIVE

## 2022-10-21 LAB — HEMOGLOBIN, FINGERSTICK: Hemoglobin: 11.7 g/dL (ref 11.1–15.9)

## 2022-10-21 MED ORDER — MEDROXYPROGESTERONE ACETATE 150 MG/ML IM SUSP
150.0000 mg | INTRAMUSCULAR | Status: AC
  Administered 2022-10-21 – 2023-07-17 (×4): 150 mg via INTRAMUSCULAR

## 2022-10-21 NOTE — Progress Notes (Signed)
[facility] [address] Main Number: [phone]   Family Planning Visit- Initial Visit  Subjective:  Sherri [name] is a 32 y.o. SBF vaper [id number]   being seen today for an initial annual visit and to discuss reproductive life planning.  The patient is currently using Hormonal Injection for pregnancy prevention. Patient reports she/her/hers  does not want a pregnancy in the next year.    she/her/hers report they are looking for a method that provides High efficacy at preventing pregnancy  Patient has the following medical conditions has Obesity BMI=31.8; Marijuana use; Hx of LEEP (loop electrosurgical excision procedure) 7/ 2015 [facility]; CIN III (cervical intraepithelial neoplasia III) 03/06/13; Anxiety and depression; Essential hypertension; and Need for HPV vaccination on their problem list.  Chief Complaint  Patient presents with   Gynecologic Exam    Pap   Contraception    PE, Depo, routine STI screening    Patient reports here for physical, pap, DMPA. No menses on DMPA. Happy with DMPA. Last vaped 2 days ago. Last MJ this am. Last ETOH 09/17/22 (2 shots Duce) q weekend. Last dental exam 08/2021. Working 65 hrs/wk and lives alone. Last sex 12/24/21 without condom; no partner currently. Forgot to take her HTN meds this am but next apt with MD is 10/31/22 Dulce Sellar, NP. Last DMPA given 07/05/22. Last pap 08/30/19 neg no HPV done. LEEP 11/2013 at [facility]. Last PE 10/01/21.   Patient denies cigs, cigars  Body mass index is 31.92 kg/m. - Patient is eligible for diabetes screening based on BMI> 25 and age >35?  not applicable HA1C ordered? not applicable  Patient reports 1  partner/s in last year. Desires STI screening?  Yes  Has patient been screened once for HCV in the past?  Yes  No results found for: "HCVAB"  Does the patient have current drug use (including MJ), have a partner with drug use, and/or has been incarcerated  since last result? Yes  If yes-- Screen for HCV through [facility]   Does the patient meet criteria for HBV testing? No  Criteria:  -Household, sexual or needle sharing contact with HBV -History of drug use -HIV positive -Those with known Hep C   Health Maintenance Due  Topic Date Due   PAP SMEAR-Modifier  08/30/2022    Review of Systems  Neurological:  Positive for headaches (resolved with addition of Inderal to Amlodipine).  All other systems reviewed and are negative.   The following portions of the patient's history were reviewed and updated as appropriate: allergies, current medications, past family history, past medical history, past social history, past surgical history and problem list. Problem list updated.   See flowsheet for other program required questions.  Objective:   Vitals:   10/21/22 0948  BP: (!) 129/93  Pulse: 63  Weight: 191 lb 12.8 oz (87 kg)  Height: 5\' 5"  (1.651 m)    Physical Exam Constitutional:      Appearance: Normal appearance. She is obese.  HENT:     Head: Normocephalic and atraumatic.     Mouth/Throat:     Mouth: Mucous membranes are moist.     Comments: Last dental exam 08/2021 Eyes:     Conjunctiva/sclera: Conjunctivae normal.  Neck:     Thyroid: No thyroid mass, thyromegaly or thyroid tenderness.  Cardiovascular:     Rate and Rhythm: Normal rate and regular rhythm.  Pulmonary:     Effort: Pulmonary effort is normal.  Breath sounds: Normal breath sounds.  Chest:  Breasts:    Right: Normal.     Left: Normal.  Abdominal:     Palpations: Abdomen is soft.     Comments: Soft without masses or tenderness, fair tone  Genitourinary:    General: Normal vulva.     Exam position: Lithotomy position.     Vagina: Vaginal discharge (grey leukorrhea, ph>4.5) present.     Cervix: Normal.     Uterus: Normal.      Adnexa: Right adnexa normal and left adnexa normal.     Rectum: Normal.     Comments: Pap done Musculoskeletal:         General: Normal range of motion.     Cervical back: Normal range of motion and neck supple.  Skin:    General: Skin is warm and dry.  Neurological:     Mental Status: She is alert.  Psychiatric:        Mood and Affect: Mood normal.       Assessment and Plan:  Sherri [name] is a 32 y.o. female presenting to the [facility] for an initial annual wellness/contraceptive visit  Contraception counseling: Reviewed options based on patient desire and reproductive life plan. Patient is interested in Hormonal Injection. This was provided to the patient today.  if not why not clearly documented  Risks, benefits, and typical effectiveness rates were reviewed.  Questions were answered.  Written information was also given to the patient to review.    The patient will follow up in  11-13 weeks for surveillance.  The patient was told to call with any further questions, or with any concerns about this method of contraception.  Emphasized use of condoms 100% of the time for STI prevention.  Educated on ECP and assessed for need of ECP. Patient reported not meeting criteria.  Reviewed options and patient desired No method of ECP, declined all    1. Family planning May have DMPA 150 mg IM q 11-13 wks x 1 year Treat wet mount per standing orders Immunization nurse consult  - WET PREP FOR TRICH, YEAST, CLUE - Hemoglobin, venipuncture - Chlamydia/Gonorrhea [place] [facility] - Syphilis Serology, [facility] - HIV/HCV [place] - IGP, Aptima HPV  2. Encounter for surveillance of injectable contraceptive See above  3. CIN III (cervical intraepithelial neoplasia III) Pap today  4. Anxiety and depression On Wellbutrin  5. Essential hypertension On Amlodipine and Inderal Forgot to take meds today Next apt 10/31/22 with Dulce Sellar, NP   No follow-ups on file.  Future Appointments  Date Time Provider Department Center  10/31/2022  3:40 PM Dulce Sellar,  NP [facility]    Alberteen Spindle, CNM

## 2022-10-21 NOTE — Progress Notes (Addendum)
Pt appointment for PE, pap, STI screening. Seen by Lesia Sago. Family planning packet given and contents reviewed. Latex free condoms given. Wet prep results negative and reviewed with pt. Depo administered per order.

## 2022-10-26 LAB — IGP, APTIMA HPV
HPV Aptima: POSITIVE — AB
PAP Smear Comment: 0

## 2022-10-31 ENCOUNTER — Ambulatory Visit (INDEPENDENT_AMBULATORY_CARE_PROVIDER_SITE_OTHER): Payer: BC Managed Care – PPO | Admitting: Family

## 2022-10-31 ENCOUNTER — Encounter: Payer: Self-pay | Admitting: Family

## 2022-10-31 VITALS — BP 120/80 | HR 97 | Temp 98.0°F | Ht 65.0 in | Wt 194.4 lb

## 2022-10-31 DIAGNOSIS — Z136 Encounter for screening for cardiovascular disorders: Secondary | ICD-10-CM

## 2022-10-31 DIAGNOSIS — Z0000 Encounter for general adult medical examination without abnormal findings: Secondary | ICD-10-CM

## 2022-10-31 DIAGNOSIS — I10 Essential (primary) hypertension: Secondary | ICD-10-CM | POA: Diagnosis not present

## 2022-10-31 DIAGNOSIS — F32A Depression, unspecified: Secondary | ICD-10-CM | POA: Diagnosis not present

## 2022-10-31 DIAGNOSIS — F419 Anxiety disorder, unspecified: Secondary | ICD-10-CM

## 2022-10-31 MED ORDER — PROPRANOLOL HCL 10 MG PO TABS: 10.0000 mg | ORAL_TABLET | Freq: Two times a day (BID) | ORAL | 5 refills | Status: AC | PRN

## 2022-10-31 MED ORDER — BUPROPION HCL ER (SR) 100 MG PO TB12: 100.0000 mg | ORAL_TABLET | Freq: Two times a day (BID) | ORAL | 5 refills | Status: DC

## 2022-10-31 NOTE — Assessment & Plan Note (Addendum)
chronic BP a little up last visit, taking Amlodipine 2.5mg  bid, but said she forgets 2nd dose often, states she did better this time & BP good also says HA are gone, and taking Propranolol 10mg  bid daily (mainly for anxiety, but could be helping HA) f/u in 3 mos

## 2022-10-31 NOTE — Assessment & Plan Note (Addendum)
Chronic increased Bupropion to 150mg  12h bid last visit, but she had nausea, so advised to reduce to 100mg  again bid until in the office again no nausea on reduced dose & taking the Propranolol bid every day which has helped her anxiety sx as well as HA no refill needed today f/u 3 mos

## 2022-10-31 NOTE — Progress Notes (Deleted)
Patient ID: Sherri [name], female    DOB: [birth year], 32 y.o.   MRN: [id number]  No chief complaint on file.   HPI: Hypertension: Patient is currently maintained on the following medications for blood pressure: Amlodipine - causing a lot of sweating, hot flashes. Taking 2.5mg  bid. Having headaches, sometimes wakes up with it, sometimes other times.  Patient reports good compliance with blood pressure medications. Patient denies chest pain, shortness of breath or swelling. Anxiety/Depression: Patient complains of anxiety disorder.   She has the following symptoms: chest pain, difficulty concentrating, feelings of losing control, irritable, palpitations, racing thoughts, shortness of breath, sweating.  Onset of symptoms was approximately  years ago, She denies current suicidal and homicidal ideation. Risk factors: negative life event termination of long term relationship x 2.  Previous treatment includes nothing. Bupropion, doing ok, tolerating, but having increased stress at her work and now having to care for her mom as well, she is feeling overwhelmed.  Assessment & Plan:  Anxiety and depression  Essential hypertension  Need for HPV vaccination    Subjective:    Outpatient Medications Prior to Visit  Medication Sig Dispense Refill   acetaminophen (TYLENOL) 325 MG tablet Take 2 tablets (650 mg total) by mouth every 6 (six) hours.     amLODipine (NORVASC) 2.5 MG tablet Take 2 tablets (5 mg total) by mouth 2 (two) times daily. 180 tablet 1   buPROPion ER (WELLBUTRIN SR) 100 MG 12 hr tablet Take 1 tablet (100 mg total) by mouth 2 (two) times daily. 60 tablet 0   Multiple Vitamins-Minerals (MULTIVITAMIN WITH MINERALS) tablet Take 1 tablet by mouth daily. 100 tablet 0   propranolol (INDERAL) 10 MG tablet Take 1 tablet (10 mg total) by mouth 2 (two) times daily as needed (Anxiety, headache prevention). 60 tablet 2   Facility-Administered Medications Prior to Visit  Medication Dose  Route Frequency Provider Last Rate Last Admin   medroxyPROGESTERone (DEPO-PROVERA) injection 150 mg  150 mg Intramuscular Q90 days Alberteen Spindle, CNM   150 mg at 10/21/22 1111   Past Medical History:  Diagnosis Date   ADHD    Anemia    Anxiety    Depression    Essential hypertension 10/28/2021   GERD (gastroesophageal reflux disease)    diet controlled   Pelvic pain in female 07/23/2020   Last Assessment & Plan:  Formatting of this note might be different from the original. -- Dyspareunia since 2015 following a LEEP procedure.  -- Describes pain as deep insertional pain. No pain ellicited with oral intercourse. No pain with BMs.  -- On exam, muscle tightness noted on left side of vagina in area of levator ani muscles. Strong sense of discomfort noted with posterior blade of speculu   Stomach ulcer    Vaginal Pap smear, abnormal    2014   Past Surgical History:  Procedure Laterality Date   ELBOW SURGERY Left    LEEP     OPEN REDUCTION INTERNAL FIXATION (ORIF) FINGER WITH RADIAL BONE GRAFT Left 02/04/2022   Procedure: CLOSED TREATMENT OF LEFT RING FINGER FRACTURE;  Surgeon: Mack Hook, MD;  Location: [facility] OR;  Service: Orthopedics;  Laterality: Left;  LENGTH OF SURGERY: 45 MINUTES   TONSILLECTOMY     Allergies  Allergen Reactions   Amoxicillin Hives, Swelling and Rash   Penicillins Hives, Swelling and Rash   Mushroom Extract Complex Hives, Swelling and Rash    Pt allergic to MUSHROOMS.      Objective:  Physical Exam Vitals and nursing note reviewed.  Constitutional:      Appearance: Normal appearance.  Cardiovascular:     Rate and Rhythm: Normal rate and regular rhythm.  Pulmonary:     Effort: Pulmonary effort is normal.     Breath sounds: Normal breath sounds.  Musculoskeletal:        General: Normal range of motion.  Skin:    General: Skin is warm and dry.  Neurological:     Mental Status: She is alert.  Psychiatric:        Mood and Affect: Mood normal.         Behavior: Behavior normal.    There were no vitals taken for this visit. Wt Readings from Last 3 Encounters:  10/21/22 191 lb 12.8 oz (87 kg)  09/01/22 191 lb 3.2 oz (86.7 kg)  07/05/22 190 lb (86.2 kg)       Dulce Sellar, NP

## 2022-10-31 NOTE — Patient Instructions (Addendum)
It was very nice to see you today!   I will review your lab results via MyChart in a few days.  Blood pressure looks great - REMEMBER the 2nd dose EVERY day!!   Schedule a 3 month follow up visit for your blood pressure.  Have a great summer!     PLEASE NOTE:  If you had any lab tests please let us know if you have not heard back within a few days. You may see your results on MyChart before we have a chance to review them but we will give you a call once they are reviewed by [place]. If we ordered any referrals today, please let us know if you have not heard from their office within the next week.

## 2022-10-31 NOTE — Progress Notes (Signed)
Phone [phone]  Subjective:   Patient is a 32 y.o. female presenting for annual physical.    Chief Complaint  Patient presents with   Annual Exam    Non Fasting ate 4h ago-  w/ labs    HPI; Hypertension: Patient is currently maintained on the following medications for blood pressure: Amlodipine - causing a lot of sweating, hot flashes. Taking 2.5mg  bid. Having headaches, sometimes wakes up with it, sometimes other times. Had not been remembering the 2nd dose - states she has done better this time. Patient reports good compliance with blood pressure medications. Patient denies chest pain, shortness of breath or swelling. Anxiety/Depression: Patient complains of anxiety disorder.   She has the following symptoms: chest pain, difficulty concentrating, feelings of losing control, irritable, palpitations, racing thoughts, shortness of breath, sweating.  Onset of symptoms was approximately  years ago, She denies current suicidal and homicidal ideation. Risk factors: negative life event termination of long term relationship x 2.  Previous treatment includes nothing. Bupropion 100mg  bid, doing ok, tolerating, but having increased stress at her work and now having to care for her mom as well, she is feeling overwhelmed. *Last visit dose increased to  150mg  bid but caused nasuea, reduced back to 100mg  bid & started consistently taking the Propranolol bid and thinks this has helped her sx.  See problem oriented charting- ROS- full  review of systems was completed and negative except for HTN, Anxiety noted in HPI above.  The following were reviewed and entered/updated in epic: Past Medical History:  Diagnosis Date   ADHD    Anemia    Anxiety    Depression    Essential hypertension 10/28/2021   GERD (gastroesophageal reflux disease)    diet controlled   Pelvic pain in female 07/23/2020   Last Assessment & Plan:  Formatting of this note might be different from the original. -- Dyspareunia  since 2015 following a LEEP procedure.  -- Describes pain as deep insertional pain. No pain ellicited with oral intercourse. No pain with BMs.  -- On exam, muscle tightness noted on left side of vagina in area of levator ani muscles. Strong sense of discomfort noted with posterior blade of speculu   Stomach ulcer    Vaginal Pap smear, abnormal    2014   Patient Active Problem List   Diagnosis Date Noted   Vapes nicotine containing substance 10/21/2022   Need for HPV vaccination 05/31/2022   Anxiety and depression 10/28/2021   Essential hypertension 10/28/2021   Hx of LEEP (loop electrosurgical excision procedure) 7/ 2015 [facility] 09/02/2019   Obesity BMI=31.8 08/30/2019   Marijuana use 08/30/2019   CIN III (cervical intraepithelial neoplasia III) 03/06/13 03/06/2013   Past Surgical History:  Procedure Laterality Date   ELBOW SURGERY Left    LEEP     OPEN REDUCTION INTERNAL FIXATION (ORIF) FINGER WITH RADIAL BONE GRAFT Left 02/04/2022   Procedure: CLOSED TREATMENT OF LEFT RING FINGER FRACTURE;  Surgeon: Mack Hook, MD;  Location: [facility] OR;  Service: Orthopedics;  Laterality: Left;  LENGTH OF SURGERY: 45 MINUTES   TONSILLECTOMY      Family History  Problem Relation Age of Onset   Hypertension Mother    Diabetes Mother    Thyroid cancer Mother    Renal Disease Mother        Stage 4   Heart attack Father    Diabetes Maternal Grandmother    Heart attack Maternal Grandmother    Heart disease Maternal Grandmother  Depression Maternal Grandmother    Heart disease Maternal Grandfather    Kidney disease Maternal Grandfather    Colon cancer Paternal Grandmother    Heart disease Paternal Grandmother     Medications- reviewed and updated Current Outpatient Medications  Medication Sig Dispense Refill   amLODipine (NORVASC) 2.5 MG tablet Take 2 tablets (5 mg total) by mouth 2 (two) times daily. 180 tablet 1   Multiple Vitamins-Minerals (MULTIVITAMIN WITH MINERALS) tablet Take 1 tablet  by mouth daily. 100 tablet 0   buPROPion ER (WELLBUTRIN SR) 100 MG 12 hr tablet Take 1 tablet (100 mg total) by mouth 2 (two) times daily. 60 tablet 5   propranolol (INDERAL) 10 MG tablet Take 1 tablet (10 mg total) by mouth 2 (two) times daily as needed (Anxiety, headache prevention). 60 tablet 5   Current Facility-Administered Medications  Medication Dose Route Frequency Provider Last Rate Last Admin   medroxyPROGESTERone (DEPO-PROVERA) injection 150 mg  150 mg Intramuscular Q90 days Alberteen Spindle, CNM   150 mg at 10/21/22 1111    Allergies-reviewed and updated Allergies  Allergen Reactions   Amoxicillin Hives, Swelling and Rash   Penicillins Hives, Swelling and Rash   Mushroom Extract Complex Hives, Swelling and Rash    Pt allergic to MUSHROOMS.    Social History   Social History Narrative   Not on file    Objective:  BP 120/80   Pulse 97   Temp 98 F (36.7 C) (Temporal)   Ht 5\' 5"  (1.651 m)   Wt 194 lb 6.4 oz (88.2 kg)   SpO2 100%   BMI 32.35 kg/m  Physical Exam Vitals and nursing note reviewed.  Constitutional:      Appearance: Normal appearance.  HENT:     Head: Normocephalic.     Right Ear: Tympanic membrane normal.     Left Ear: Tympanic membrane normal.     Nose: Nose normal.     Mouth/Throat:     Mouth: Mucous membranes are moist.  Eyes:     Pupils: Pupils are equal, round, and reactive to light.  Cardiovascular:     Rate and Rhythm: Normal rate and regular rhythm.  Pulmonary:     Effort: Pulmonary effort is normal.     Breath sounds: Normal breath sounds.  Musculoskeletal:        General: Normal range of motion.     Cervical back: Normal range of motion.  Lymphadenopathy:     Cervical: No cervical adenopathy.  Skin:    General: Skin is warm and dry.  Neurological:     Mental Status: She is alert.  Psychiatric:        Mood and Affect: Mood normal.        Behavior: Behavior normal.      Assessment and Plan   Health Maintenance  counseling: 1. Anticipatory guidance: Patient counseled regarding regular dental exams q6 months, eye exams,  avoiding smoking and second hand smoke, limiting alcohol to 1 beverage per day, no illicit drugs.   2. Risk factor reduction:  Advised patient of need for regular exercise and diet rich with fruits and vegetables to reduce risk of heart attack and stroke. Wt Readings from Last 3 Encounters:  10/31/22 194 lb 6.4 oz (88.2 kg)  10/21/22 191 lb 12.8 oz (87 kg)  09/01/22 191 lb 3.2 oz (86.7 kg)   3. Immunizations/screenings/ancillary studies Immunization History  Administered Date(s) Administered   HPV 9-valent 02/28/2022, 05/31/2022   Hepatitis A 05/13/2009   Hepatitis  B 05/28/1991, 08/27/1991, 11/16/1992   Hpv-Unspecified 05/13/2009   Influenza,inj,Quad PF,6+ Mos 02/28/2022   Janssen (J&J) SARS-COV-2 Vaccination 08/16/2019, 03/13/2020   Tdap 05/13/2009, 10/28/2021   There are no preventive care reminders to display for this patient.  4. Cervical cancer screening: done this month at GYN, positive HPV, pt has had vaccines, will research more & let her know 5. Skin cancer screening- advised regular sunscreen use. Denies worrisome, changing, or new skin lesions.  6. Birth control/STD check: DEPO/N/A 7. Smoking associated screening:  smoking weed about 2-3x/ week.  8. Alcohol screening: rare.  Anxiety and depression Assessment & Plan: Chronic increased Bupropion to 150mg  12h bid last visit, but she had nausea, so advised to reduce to 100mg  again bid until in the office again no nausea on reduced dose & taking the Propranolol bid every day which has helped her anxiety sx as well as HA no refill needed today f/u 3 mos  Orders: -     Propranolol HCl; Take 1 tablet (10 mg total) by mouth 2 (two) times daily as needed (Anxiety, headache prevention).  Dispense: 60 tablet; Refill: 5 -     buPROPion HCl ER (SR); Take 1 tablet (100 mg total) by mouth 2 (two) times daily.  Dispense: 60  tablet; Refill: 5  Essential hypertension Assessment & Plan: chronic BP a little up last visit, taking Amlodipine 2.5mg  bid, but said she forgets 2nd dose often, states she did better this time & BP good also says HA are gone, and taking Propranolol 10mg  bid daily (mainly for anxiety, but could be helping HA) f/u in 3 mos   Annual physical exam -     CBC with Differential/Platelet -     Comprehensive metabolic panel -     Lipid panel -     TSH    Recommended follow up:  Return in about 3 months (around 01/31/2023) for med refills, HTN, anxiety. Future Appointments  Date Time Provider Department Center  02/01/2023  3:40 PM Dulce Sellar, NP [facility]    Lab/Order associations: non-fasting - last ate 4h ago    Dulce Sellar, NP

## 2022-11-01 LAB — LIPID PANEL
Cholesterol: 159 mg/dL (ref 0–200)
HDL: 36.4 mg/dL — ABNORMAL LOW (ref >39.00)
NonHDL: 122.3
Total CHOL/HDL Ratio: 4
Triglycerides: 219 mg/dL — ABNORMAL HIGH (ref 0.0–149.0)
VLDL: 43.8 mg/dL — ABNORMAL HIGH (ref 0.0–40.0)

## 2022-11-01 LAB — CBC WITH DIFFERENTIAL/PLATELET
Basophils Absolute: 0.1 10*3/uL (ref 0.0–0.1)
Basophils Relative: 0.9 % (ref 0.0–3.0)
Eosinophils Absolute: 0.1 10*3/uL (ref 0.0–0.7)
Eosinophils Relative: 1.3 % (ref 0.0–5.0)
HCT: 36.2 % (ref 36.0–46.0)
Hemoglobin: 11.8 g/dL — ABNORMAL LOW (ref 12.0–15.0)
Lymphocytes Relative: 46.2 % — ABNORMAL HIGH (ref 12.0–46.0)
Lymphs Abs: 3.1 10*3/uL (ref 0.7–4.0)
MCHC: 32.5 g/dL (ref 30.0–36.0)
MCV: 90.9 fl (ref 78.0–100.0)
Monocytes Absolute: 0.6 10*3/uL (ref 0.1–1.0)
Monocytes Relative: 8.9 % (ref 3.0–12.0)
Neutro Abs: 2.8 10*3/uL (ref 1.4–7.7)
Neutrophils Relative %: 42.7 % — ABNORMAL LOW (ref 43.0–77.0)
Platelets: 288 10*3/uL (ref 150.0–400.0)
RBC: 3.99 Mil/uL (ref 3.87–5.11)
RDW: 14.2 % (ref 11.5–15.5)
WBC: 6.6 10*3/uL (ref 4.0–10.5)

## 2022-11-01 LAB — LDL CHOLESTEROL, DIRECT: Direct LDL: 104 mg/dL

## 2022-11-01 LAB — COMPREHENSIVE METABOLIC PANEL
ALT: 16 U/L (ref 0–35)
AST: 15 U/L (ref 0–37)
Albumin: 4.5 g/dL (ref 3.5–5.2)
Alkaline Phosphatase: 65 U/L (ref 39–117)
BUN: 14 mg/dL (ref 6–23)
CO2: 24 mEq/L (ref 19–32)
Calcium: 10.1 mg/dL (ref 8.4–10.5)
Chloride: 110 mEq/L (ref 96–112)
Creatinine, Ser: 0.94 mg/dL (ref 0.40–1.20)
GFR: 80.75 mL/min (ref >60.00)
Glucose, Bld: 76 mg/dL (ref 70–99)
Potassium: 4.1 mEq/L (ref 3.5–5.1)
Sodium: 141 mEq/L (ref 135–145)
Total Bilirubin: 0.3 mg/dL (ref 0.2–1.2)
Total Protein: 7.7 g/dL (ref 6.0–8.3)

## 2022-11-01 LAB — TSH: TSH: 0.96 u[IU]/mL (ref 0.35–5.50)

## 2022-11-25 ENCOUNTER — Encounter: Payer: Self-pay | Admitting: Family

## 2022-11-25 ENCOUNTER — Ambulatory Visit (INDEPENDENT_AMBULATORY_CARE_PROVIDER_SITE_OTHER): Payer: BC Managed Care – PPO | Admitting: Family

## 2022-11-25 VITALS — BP 120/84 | HR 80 | Temp 97.8°F | Ht 65.0 in | Wt 190.0 lb

## 2022-11-25 DIAGNOSIS — G43109 Migraine with aura, not intractable, without status migrainosus: Secondary | ICD-10-CM | POA: Diagnosis not present

## 2022-11-25 MED ORDER — KETOROLAC TROMETHAMINE 60 MG/2ML IM SOLN
60.0000 mg | Freq: Once | INTRAMUSCULAR | Status: AC
  Administered 2022-11-25: 60 mg via INTRAMUSCULAR

## 2022-11-25 MED ORDER — CYCLOBENZAPRINE HCL 5 MG PO TABS: 5.0000 mg | ORAL_TABLET | Freq: Three times a day (TID) | ORAL | 1 refills | Status: DC | PRN

## 2022-11-25 NOTE — Assessment & Plan Note (Addendum)
chronic, has had mixture of bad headaches & migraines  thought to be r/t high BP, but have persisted  started Propranolol bid last visit which helped. but then got a migraine starting 2d ago, unable to work advised ok to try and take an extra Propranolol as soon as HA starts, but med more for preventative given toradol injection in office, advised on SE sending Flexeril 5-10mg  take as soon as HA starts  if HA reoccurs advised increasing Propranolol to 20mg  bid & let me know to send more pills f/u 3 mos or prn

## 2022-11-25 NOTE — Progress Notes (Signed)
Patient ID: Sherri [name], female    DOB: August 17, 1990, 32 y.o.   MRN: [id number]  Chief Complaint  Patient presents with   Migraine    Pain on left side of head     HPI:  Migraine:  pt reports the Propranolol has been helping prevent her headache/migraines since the last visit, but she got one 2 days ago and has not been able to get rid of it, has had to miss work. She was unsure if she could take more of the Propranolol.   Assessment & Plan:  Migraine with aura and without status migrainosus, not intractable Assessment & Plan: chronic, has had mixture of bad headaches & migraines  thought to be r/t high BP, but have persisted  started Propranolol bid last visit which helped. but then got a migraine starting 2d ago, unable to work advised ok to try and take an extra Propranolol as soon as HA starts, but med more for preventative given toradol injection in office, advised on SE sending Flexeril 5-10mg  take as soon as HA starts  if HA reoccurs advised increasing Propranolol to 20mg  bid & let me know to send more pills f/u 3 mos or prn  Orders: -     Ketorolac Tromethamine -     Cyclobenzaprine HCl; Take 1-2 tablets (5-10 mg total) by mouth 3 (three) times daily as needed (Take at the very start of migraine.).  Dispense: 30 tablet; Refill: 1   Subjective:    Outpatient Medications Prior to Visit  Medication Sig Dispense Refill   amLODipine (NORVASC) 2.5 MG tablet Take 2 tablets (5 mg total) by mouth 2 (two) times daily. 180 tablet 1   buPROPion ER (WELLBUTRIN SR) 100 MG 12 hr tablet Take 1 tablet (100 mg total) by mouth 2 (two) times daily. 60 tablet 5   Multiple Vitamins-Minerals (MULTIVITAMIN WITH MINERALS) tablet Take 1 tablet by mouth daily. 100 tablet 0   propranolol (INDERAL) 10 MG tablet Take 1 tablet (10 mg total) by mouth 2 (two) times daily as needed (Anxiety, headache prevention). 60 tablet 5   Facility-Administered Medications Prior to Visit  Medication Dose Route  Frequency Provider Last Rate Last Admin   medroxyPROGESTERone (DEPO-PROVERA) injection 150 mg  150 mg Intramuscular Q90 days Alberteen Spindle, CNM   150 mg at 10/21/22 1111   Past Medical History:  Diagnosis Date   ADHD    Anemia    Anxiety    Depression    Essential hypertension 10/28/2021   GERD (gastroesophageal reflux disease)    diet controlled   Pelvic pain in female 07/23/2020   Last Assessment & Plan:  Formatting of this note might be different from the original. -- Dyspareunia since 2015 following a LEEP procedure.  -- Describes pain as deep insertional pain. No pain ellicited with oral intercourse. No pain with BMs.  -- On exam, muscle tightness noted on left side of vagina in area of levator ani muscles. Strong sense of discomfort noted with posterior blade of speculu   Stomach ulcer    Vaginal Pap smear, abnormal    2014   Past Surgical History:  Procedure Laterality Date   ELBOW SURGERY Left    LEEP     OPEN REDUCTION INTERNAL FIXATION (ORIF) FINGER WITH RADIAL BONE GRAFT Left 02/04/2022   Procedure: CLOSED TREATMENT OF LEFT RING FINGER FRACTURE;  Surgeon: Mack Hook, MD;  Location: [facility] OR;  Service: Orthopedics;  Laterality: Left;  LENGTH OF SURGERY: 45 MINUTES  TONSILLECTOMY     Allergies  Allergen Reactions   Amoxicillin Hives, Swelling and Rash   Penicillins Hives, Swelling and Rash   Mushroom Extract Complex Hives, Swelling and Rash    Pt allergic to MUSHROOMS.      Objective:    Physical Exam Vitals and nursing note reviewed.  Constitutional:      Appearance: Normal appearance.  Cardiovascular:     Rate and Rhythm: Normal rate and regular rhythm.  Pulmonary:     Effort: Pulmonary effort is normal.     Breath sounds: Normal breath sounds.  Musculoskeletal:        General: Normal range of motion.  Skin:    General: Skin is warm and dry.  Neurological:     Mental Status: She is alert.  Psychiatric:        Mood and Affect: Mood normal.         Behavior: Behavior normal.    BP 120/84   Pulse 80   Temp 97.8 F (36.6 C) (Temporal)   Ht 5\' 5"  (1.651 m)   Wt 190 lb (86.2 kg)   SpO2 100%   BMI 31.62 kg/m  Wt Readings from Last 3 Encounters:  11/25/22 190 lb (86.2 kg)  10/31/22 194 lb 6.4 oz (88.2 kg)  10/21/22 191 lb 12.8 oz (87 kg)       Dulce Sellar, NP

## 2022-11-28 ENCOUNTER — Ambulatory Visit: Payer: BC Managed Care – PPO | Admitting: Family

## 2022-12-13 ENCOUNTER — Ambulatory Visit: Payer: BC Managed Care – PPO | Admitting: Physician Assistant

## 2022-12-13 VITALS — BP 126/80 | HR 68 | Temp 97.7°F | Ht 65.0 in | Wt 191.4 lb

## 2022-12-13 DIAGNOSIS — U071 COVID-19: Secondary | ICD-10-CM

## 2022-12-13 LAB — POC COVID19 BINAXNOW: SARS Coronavirus 2 Ag: POSITIVE — AB

## 2022-12-13 NOTE — Patient Instructions (Signed)
It was great to see you!  You have COVID!  Get rest and keep [place] posted if you need anything else.  COVID (or suspected COVID) home recommendations  For current/suspected COVID symptoms: - Please watch closely for new onset shortness of breath, worsening shortness of breath, dizziness, confusion or any worsening symptoms. If any of these occur, please contact us during business hours, and if after business hours, please seek urgent care or go to the closest emergency room.  -Consider purchasing a pulse oximeter. If your levels are 94% or below persistently, please seek care at the hospital.   -If you test positive for COVID, everyone, regardless of vaccination status, should stay home if you have a fever, and continue to stay home until your fever resolves without use of medication.   -Updated guidelines state that you can return to normal activities when, for at least 24 hours, symptoms are improving overall, and if a fever was present, it has been gone without use of a fever-reducing medication  -Please inform any contacts of your positive result so they can appropriately quarantine/test.  -Push fluids and try to eat small, frequent meals with protein to maintain your stamina.   Take care,  Jarold Motto PA-C

## 2022-12-13 NOTE — Progress Notes (Signed)
Sherri [name] is a 32 y.o. female here for a new problem.  History of Present Illness:   Chief Complaint  Patient presents with   Sinus Problem    Pt c/o sinus pressure and nasal congestion, since headache and left ear pain, started yesterday.    HPI  Congestion: She complains of sinus pressure and nasal congestions.  She is also experiencing accompanying intense headaches, mild chills,  and left ear pain. She states that her symptoms started yesterday.  She is currently taking Tylenol without much relief.  She was previously treated with nasal sparay Test today in the office is positive for Covid.   Past Medical History:  Diagnosis Date   ADHD    Anemia    Anxiety    Depression    Essential hypertension 10/28/2021   GERD (gastroesophageal reflux disease)    diet controlled   Pelvic pain in female 07/23/2020   Last Assessment & Plan:  Formatting of this note might be different from the original. -- Dyspareunia since 2015 following a LEEP procedure.  -- Describes pain as deep insertional pain. No pain ellicited with oral intercourse. No pain with BMs.  -- On exam, muscle tightness noted on left side of vagina in area of levator ani muscles. Strong sense of discomfort noted with posterior blade of speculu   Stomach ulcer    Vaginal Pap smear, abnormal    2014     Social History   Tobacco Use   Smoking status: Some Days    Types: E-cigarettes    Passive exposure: Never   Smokeless tobacco: Never   Tobacco comments:    Denies secondhand smoke exposure  Vaping Use   Vaping status: Never Used  Substance Use Topics   Alcohol use: Yes    Alcohol/week: 2.0 standard drinks of alcohol    Types: 2 Shots of liquor per week    Comment: last use 09/17/22   Drug use: Yes    Frequency: 2.0 times per week    Types: Marijuana    Comment: Last use 10/21/2022    Past Surgical History:  Procedure Laterality Date   ELBOW SURGERY Left    LEEP     OPEN REDUCTION INTERNAL  FIXATION (ORIF) FINGER WITH RADIAL BONE GRAFT Left 02/04/2022   Procedure: CLOSED TREATMENT OF LEFT RING FINGER FRACTURE;  Surgeon: Mack Hook, MD;  Location: [facility] OR;  Service: Orthopedics;  Laterality: Left;  LENGTH OF SURGERY: 45 MINUTES   TONSILLECTOMY      Family History  Problem Relation Age of Onset   Hypertension Mother    Diabetes Mother    Thyroid cancer Mother    Renal Disease Mother        Stage 4   Heart attack Father    Diabetes Maternal Grandmother    Heart attack Maternal Grandmother    Heart disease Maternal Grandmother    Depression Maternal Grandmother    Heart disease Maternal Grandfather    Kidney disease Maternal Grandfather    Colon cancer Paternal Grandmother    Heart disease Paternal Grandmother     Allergies  Allergen Reactions   Amoxicillin Hives, Swelling and Rash   Penicillins Hives, Swelling and Rash   Mushroom Extract Complex Hives, Swelling and Rash    Pt allergic to MUSHROOMS.    Current Medications:   Current Outpatient Medications:    amLODipine (NORVASC) 2.5 MG tablet, Take 2 tablets (5 mg total) by mouth 2 (two) times daily., Disp: 180 tablet, Rfl: 1  buPROPion ER (WELLBUTRIN SR) 100 MG 12 hr tablet, Take 1 tablet (100 mg total) by mouth 2 (two) times daily., Disp: 60 tablet, Rfl: 5   cyclobenzaprine (FLEXERIL) 5 MG tablet, Take 1-2 tablets (5-10 mg total) by mouth 3 (three) times daily as needed (Take at the very start of migraine.)., Disp: 30 tablet, Rfl: 1   Multiple Vitamins-Minerals (MULTIVITAMIN WITH MINERALS) tablet, Take 1 tablet by mouth daily., Disp: 100 tablet, Rfl: 0   propranolol (INDERAL) 10 MG tablet, Take 1 tablet (10 mg total) by mouth 2 (two) times daily as needed (Anxiety, headache prevention)., Disp: 60 tablet, Rfl: 5  Current Facility-Administered Medications:    medroxyPROGESTERone (DEPO-PROVERA) injection 150 mg, 150 mg, Intramuscular, Q90 days, Sciora, Elizabeth A, CNM, 150 mg at 10/21/22 1111   Review of  Systems:   Review of Systems  HENT:  Positive for congestion, ear pain and sinus pain.   Neurological:  Positive for headaches.    Vitals:   Vitals:   12/13/22 1059  BP: 126/80  Pulse: 68  Temp: 97.7 F (36.5 C)  TempSrc: Temporal  Weight: 191 lb 6.1 oz (86.8 kg)  Height: 5\' 5"  (1.651 m)     Body mass index is 31.85 kg/m.  Physical Exam:   Physical Exam Vitals and nursing note reviewed.  Constitutional:      General: She is not in acute distress.    Appearance: She is well-developed. She is not ill-appearing or toxic-appearing.  HENT:     Head: Normocephalic and atraumatic.     Right Ear: Tympanic membrane, ear canal and external ear normal. Tympanic membrane is not erythematous, retracted or bulging.     Left Ear: Tympanic membrane, ear canal and external ear normal. Tympanic membrane is not erythematous, retracted or bulging.     Nose: Nose normal.     Right Sinus: No maxillary sinus tenderness or frontal sinus tenderness.     Left Sinus: No maxillary sinus tenderness or frontal sinus tenderness.     Mouth/Throat:     Pharynx: Uvula midline. No posterior oropharyngeal erythema.  Eyes:     General: Lids are normal.     Conjunctiva/sclera: Conjunctivae normal.  Neck:     Trachea: Trachea normal.  Cardiovascular:     Rate and Rhythm: Normal rate and regular rhythm.     Heart sounds: Normal heart sounds, S1 normal and S2 normal.  Pulmonary:     Effort: Pulmonary effort is normal.     Breath sounds: Normal breath sounds. No decreased breath sounds, wheezing, rhonchi or rales.  Lymphadenopathy:     Cervical: Cervical adenopathy present.  Skin:    General: Skin is warm and dry.  Neurological:     Mental Status: She is alert.  Psychiatric:        Speech: Speech normal.        Behavior: Behavior normal. Behavior is cooperative.     Assessment and Plan:   COVID-19 No red flags Patient is not in any obvious distress during our visit. Discussed progression of  most viral illness, and recommended supportive care at this point in time. Discussed over the counter supportive care options, with recommendations to push fluids and rest. Reviewed return precautions including new/worsening fever, SOB, new/worsening cough or other concerns.  Recommended need to self-quarantine and practice social distancing until symptoms resolve.  I recommend that patient follow-up if symptoms worsen or persist despite treatment x 7-10 days, sooner if needed.   I,Safa M Kadhim,acting as a scribe for  Jarold Motto, PA.,have documented all relevant documentation on the behalf of Jarold Motto, PA,as directed by  Jarold Motto, PA while in the presence of Jarold Motto, [place].  I, Jarold Motto, [place], have reviewed all documentation for this visit. The documentation on 12/13/22 for the exam, diagnosis, procedures, and orders are all accurate and complete.   Jarold Motto, PA-C

## 2022-12-22 ENCOUNTER — Encounter (INDEPENDENT_AMBULATORY_CARE_PROVIDER_SITE_OTHER): Payer: Self-pay

## 2023-01-06 ENCOUNTER — Ambulatory Visit: Payer: BC Managed Care – PPO

## 2023-01-18 ENCOUNTER — Ambulatory Visit (LOCAL_COMMUNITY_HEALTH_CENTER): Payer: BC Managed Care – PPO

## 2023-01-18 VITALS — BP 128/84 | Ht 65.0 in | Wt 194.0 lb

## 2023-01-18 DIAGNOSIS — Z3009 Encounter for other general counseling and advice on contraception: Secondary | ICD-10-CM

## 2023-01-18 DIAGNOSIS — Z3042 Encounter for surveillance of injectable contraceptive: Secondary | ICD-10-CM

## 2023-01-18 DIAGNOSIS — Z308 Encounter for other contraceptive management: Secondary | ICD-10-CM

## 2023-01-18 DIAGNOSIS — Z30013 Encounter for initial prescription of injectable contraceptive: Secondary | ICD-10-CM

## 2023-01-18 NOTE — Progress Notes (Signed)
12 weeks 5 days post depo. Voices no concerns. Depo given today per order by Hazle Coca, CNM dated 10/21/2022. Tolerated well LUOQ. Next depo due 04/05/2023, has reminder. Jerel Shepherd, RN

## 2023-02-01 ENCOUNTER — Ambulatory Visit: Payer: BC Managed Care – PPO | Admitting: Family

## 2023-02-01 NOTE — Progress Notes (Deleted)
Patient ID: Sherri [name], female    DOB: 07-02-1990, 32 y.o.   MRN: [id number]  No chief complaint on file.   HPI: Migraine:  pt reports the Propranolol has been helping prevent her headache/migraines since the last visit, but she got one 2 days ago and has not been able to get rid of it, has had to miss work. She was unsure if she could take more of the Propranolol.  Hypertension: Patient is currently maintained on the following medications for blood pressure: Amlodipine - causing a lot of sweating, hot flashes. Taking 2.5mg  bid. Having headaches, sometimes wakes up with it, sometimes other times. Had not been remembering the 2nd dose - states she has done better this time. Patient reports good compliance with blood pressure medications. Patient denies chest pain, shortness of breath or swelling. Anxiety/Depression: Patient complains of anxiety disorder.   She has the following symptoms: chest pain, difficulty concentrating, feelings of losing control, irritable, palpitations, racing thoughts, shortness of breath, sweating.  Onset of symptoms was approximately  years ago, She denies current suicidal and homicidal ideation. Risk factors: negative life event termination of long term relationship x 2.  Previous treatment includes nothing. Bupropion 100mg  bid, doing ok, tolerating, but having increased stress at her work and now having to care for her mom as well, she is feeling overwhelmed. *Last visit dose increased to  150mg  bid but caused nasuea, reduced back to 100mg  bid & started consistently taking the Propranolol bid and thinks this has helped her sx.  Assessment & Plan:  There are no diagnoses linked to this encounter.  Subjective:    Outpatient Medications Prior to Visit  Medication Sig Dispense Refill   amLODipine (NORVASC) 2.5 MG tablet Take 2 tablets (5 mg total) by mouth 2 (two) times daily. 180 tablet 1   buPROPion ER (WELLBUTRIN SR) 100 MG 12 hr tablet Take 1 tablet (100  mg total) by mouth 2 (two) times daily. 60 tablet 5   cyclobenzaprine (FLEXERIL) 5 MG tablet Take 1-2 tablets (5-10 mg total) by mouth 3 (three) times daily as needed (Take at the very start of migraine.). 30 tablet 1   Multiple Vitamins-Minerals (MULTIVITAMIN WITH MINERALS) tablet Take 1 tablet by mouth daily. 100 tablet 0   propranolol (INDERAL) 10 MG tablet Take 1 tablet (10 mg total) by mouth 2 (two) times daily as needed (Anxiety, headache prevention). 60 tablet 5   Facility-Administered Medications Prior to Visit  Medication Dose Route Frequency Provider Last Rate Last Admin   medroxyPROGESTERone (DEPO-PROVERA) injection 150 mg  150 mg Intramuscular Q90 days Alberteen Spindle, CNM   150 mg at 01/18/23 1529   Past Medical History:  Diagnosis Date   ADHD    Anemia    Anxiety    Depression    Essential hypertension 10/28/2021   GERD (gastroesophageal reflux disease)    diet controlled   Pelvic pain in female 07/23/2020   Last Assessment & Plan:  Formatting of this note might be different from the original. -- Dyspareunia since 2015 following a LEEP procedure.  -- Describes pain as deep insertional pain. No pain ellicited with oral intercourse. No pain with BMs.  -- On exam, muscle tightness noted on left side of vagina in area of levator ani muscles. Strong sense of discomfort noted with posterior blade of speculu   Stomach ulcer    Vaginal Pap smear, abnormal    2014   Past Surgical History:  Procedure Laterality Date   ELBOW  SURGERY Left    LEEP     OPEN REDUCTION INTERNAL FIXATION (ORIF) FINGER WITH RADIAL BONE GRAFT Left 02/04/2022   Procedure: CLOSED TREATMENT OF LEFT RING FINGER FRACTURE;  Surgeon: Mack Hook, MD;  Location: [facility] OR;  Service: Orthopedics;  Laterality: Left;  LENGTH OF SURGERY: 45 MINUTES   TONSILLECTOMY     Allergies  Allergen Reactions   Amoxicillin Hives, Swelling and Rash   Penicillins Hives, Swelling and Rash   Mushroom Extract Complex Hives,  Swelling and Rash    Pt allergic to MUSHROOMS.      Objective:    Physical Exam There were no vitals taken for this visit. Wt Readings from Last 3 Encounters:  01/18/23 194 lb (88 kg)  12/13/22 191 lb 6.1 oz (86.8 kg)  11/25/22 190 lb (86.2 kg)       Dulce Sellar, NP

## 2023-03-16 ENCOUNTER — Ambulatory Visit: Payer: BC Managed Care – PPO | Admitting: Family

## 2023-03-16 VITALS — BP 118/85 | HR 75 | Temp 98.0°F | Ht 65.0 in | Wt 200.2 lb

## 2023-03-16 DIAGNOSIS — H938X1 Other specified disorders of right ear: Secondary | ICD-10-CM

## 2023-03-16 NOTE — Progress Notes (Signed)
Patient ID: Sherri [name], female    DOB: 09/10/1990, 32 y.o.   MRN: [id number]  Chief Complaint  Patient presents with   Ear Fullness    Pt c/o right ear fullness, present for a week. Has tried ear drops after virtual visit on 11/1, which did not help sx.    Discussed the use of AI scribe software for clinical note transcription with the patient, who gave verbal consent to proceed.  History of Present Illness   The patient presents with right ear discomfort, described as feeling 'gummy' and 'stuffy.' The symptoms began after showering and going outside. She has been experiencing ongoing ear discomfort, which she attributes to a recent COVID-19 infection. She denies any recent cold or illness. She has been using prescribed eye drops given via a recent video visit. The patient reports difficulty hearing, with the TV volume needing to be increased to forty.     Assessment & Plan:     Right Ear Fullness - Patient reports a sensation of fullness and decreased hearing in the left ear. No recent illness reported. Ear examination reveals impacted cerumen. Able to clear cerumen partially, not able to visualize TM. -Performed cerumen disimpaction in the right ear to improve symptoms and allow for visualization of the tympanic membrane. -Continue Polymyxin ear drops, 2 drops bid for 5 more days. -Call Monday if sx are not improved.     Subjective:    Outpatient Medications Prior to Visit  Medication Sig Dispense Refill   amLODipine (NORVASC) 2.5 MG tablet Take 2 tablets (5 mg total) by mouth 2 (two) times daily. 180 tablet 1   buPROPion ER (WELLBUTRIN SR) 100 MG 12 hr tablet Take 1 tablet (100 mg total) by mouth 2 (two) times daily. 60 tablet 5   cyclobenzaprine (FLEXERIL) 5 MG tablet Take 1-2 tablets (5-10 mg total) by mouth 3 (three) times daily as needed (Take at the very start of migraine.). 30 tablet 1   Multiple Vitamins-Minerals (MULTIVITAMIN WITH MINERALS) tablet Take 1 tablet by mouth  daily. 100 tablet 0   propranolol (INDERAL) 10 MG tablet Take 1 tablet (10 mg total) by mouth 2 (two) times daily as needed (Anxiety, headache prevention). 60 tablet 5   Facility-Administered Medications Prior to Visit  Medication Dose Route Frequency Provider Last Rate Last Admin   medroxyPROGESTERone (DEPO-PROVERA) injection 150 mg  150 mg Intramuscular Q90 days Alberteen Spindle, CNM   150 mg at 01/18/23 1529   Past Medical History:  Diagnosis Date   ADHD    Anemia    Anxiety    Depression    Essential hypertension 10/28/2021   GERD (gastroesophageal reflux disease)    diet controlled   Pelvic pain in female 07/23/2020   Last Assessment & Plan:  Formatting of this note might be different from the original. -- Dyspareunia since 2015 following a LEEP procedure.  -- Describes pain as deep insertional pain. No pain ellicited with oral intercourse. No pain with BMs.  -- On exam, muscle tightness noted on left side of vagina in area of levator ani muscles. Strong sense of discomfort noted with posterior blade of speculu   Stomach ulcer    Vaginal Pap smear, abnormal    2014   Past Surgical History:  Procedure Laterality Date   ELBOW SURGERY Left    LEEP     OPEN REDUCTION INTERNAL FIXATION (ORIF) FINGER WITH RADIAL BONE GRAFT Left 02/04/2022   Procedure: CLOSED TREATMENT OF LEFT RING FINGER FRACTURE;  Surgeon: Mack Hook, MD;  Location: [facility] OR;  Service: Orthopedics;  Laterality: Left;  LENGTH OF SURGERY: 45 MINUTES   TONSILLECTOMY     Allergies  Allergen Reactions   Amoxicillin Hives, Swelling and Rash   Penicillins Hives, Swelling and Rash   Mushroom Extract Complex Hives, Swelling and Rash    Pt allergic to MUSHROOMS.      Objective:    Physical Exam Vitals and nursing note reviewed. Exam conducted with a chaperone present.  Constitutional:      Appearance: Normal appearance.  HENT:     Right Ear: Tenderness (w/erythema in canal) present. There is impacted cerumen  (not able to visualize TM after partial removal of cerumen).     Left Ear: Tympanic membrane and ear canal normal.  Cardiovascular:     Rate and Rhythm: Normal rate and regular rhythm.  Pulmonary:     Effort: Pulmonary effort is normal.     Breath sounds: Normal breath sounds.  Musculoskeletal:        General: Normal range of motion.  Skin:    General: Skin is warm and dry.  Neurological:     Mental Status: She is alert.  Psychiatric:        Mood and Affect: Mood normal.        Behavior: Behavior normal.    BP 118/85 (BP Location: Left Arm, Patient Position: Sitting, Cuff Size: Large)   Pulse 75   Temp 98 F (36.7 C) (Temporal)   Ht 5\' 5"  (1.651 m)   Wt 200 lb 3.2 oz (90.8 kg)   SpO2 99%   BMI 33.32 kg/m  Wt Readings from Last 3 Encounters:  03/16/23 200 lb 3.2 oz (90.8 kg)  01/18/23 194 lb (88 kg)  12/13/22 191 lb 6.1 oz (86.8 kg)       Dulce Sellar, NP

## 2023-04-20 ENCOUNTER — Ambulatory Visit: Payer: BC Managed Care – PPO

## 2023-04-20 VITALS — BP 121/66 | Ht 65.0 in | Wt 198.5 lb

## 2023-04-20 DIAGNOSIS — Z308 Encounter for other contraceptive management: Secondary | ICD-10-CM

## 2023-04-20 DIAGNOSIS — Z30013 Encounter for initial prescription of injectable contraceptive: Secondary | ICD-10-CM

## 2023-04-20 DIAGNOSIS — Z3009 Encounter for other general counseling and advice on contraception: Secondary | ICD-10-CM

## 2023-04-20 DIAGNOSIS — Z3042 Encounter for surveillance of injectable contraceptive: Secondary | ICD-10-CM

## 2023-04-20 NOTE — Progress Notes (Signed)
13 Weeks   1 Days since last Depo  Voices no concerns today.  Counseled to adhere to 11 to 13 week intervals between depo injections for optimal benefit.  Depo given today per order by Hazle Coca, CNM  dated 10/21/2022.  Tolerated well RUOQ.  Next depo due 07/06/2023,  has reminder card.  Jerel Shepherd, RN

## 2023-04-25 ENCOUNTER — Encounter (HOSPITAL_BASED_OUTPATIENT_CLINIC_OR_DEPARTMENT_OTHER): Payer: Self-pay | Admitting: Emergency Medicine

## 2023-04-25 ENCOUNTER — Other Ambulatory Visit: Payer: Self-pay

## 2023-04-25 ENCOUNTER — Emergency Department (HOSPITAL_BASED_OUTPATIENT_CLINIC_OR_DEPARTMENT_OTHER)
Admission: EM | Admit: 2023-04-25 | Discharge: 2023-04-25 | Disposition: A | Discharge status: Home / Self Care | Payer: BC Managed Care – PPO | Attending: Emergency Medicine | Admitting: Emergency Medicine

## 2023-04-25 DIAGNOSIS — M5441 Lumbago with sciatica, right side: Secondary | ICD-10-CM | POA: Diagnosis not present

## 2023-04-25 DIAGNOSIS — M5431 Sciatica, right side: Secondary | ICD-10-CM | POA: Diagnosis not present

## 2023-04-25 DIAGNOSIS — Z79899 Other long term (current) drug therapy: Secondary | ICD-10-CM | POA: Diagnosis not present

## 2023-04-25 DIAGNOSIS — M5432 Sciatica, left side: Secondary | ICD-10-CM | POA: Diagnosis not present

## 2023-04-25 DIAGNOSIS — I10 Essential (primary) hypertension: Secondary | ICD-10-CM | POA: Insufficient documentation

## 2023-04-25 DIAGNOSIS — M545 Low back pain, unspecified: Secondary | ICD-10-CM | POA: Diagnosis not present

## 2023-04-25 DIAGNOSIS — M5442 Lumbago with sciatica, left side: Secondary | ICD-10-CM | POA: Diagnosis not present

## 2023-04-25 LAB — PREGNANCY, URINE: Preg Test, Ur: NEGATIVE

## 2023-04-25 LAB — URINALYSIS, ROUTINE W REFLEX MICROSCOPIC
Bacteria, UA: NONE SEEN
Bilirubin Urine: NEGATIVE
Glucose, UA: NEGATIVE mg/dL
Ketones, ur: NEGATIVE mg/dL
Leukocytes,Ua: NEGATIVE
Nitrite: NEGATIVE
Specific Gravity, Urine: 1.024 (ref 1.005–1.030)
pH: 5.5 (ref 5.0–8.0)

## 2023-04-25 MED ORDER — PREDNISONE 20 MG PO TABS: 40.0000 mg | ORAL_TABLET | Freq: Every day | ORAL | 0 refills | Status: DC

## 2023-04-25 MED ORDER — KETOROLAC TROMETHAMINE 15 MG/ML IJ SOLN
15.0000 mg | Freq: Once | INTRAMUSCULAR | Status: AC
  Administered 2023-04-25: 15 mg via INTRAMUSCULAR
  Filled 2023-04-25: qty 1

## 2023-04-25 NOTE — ED Triage Notes (Signed)
Lower back and bilateral leg pain.  Not relieved with otc medication (oral or topical)

## 2023-04-25 NOTE — ED Provider Notes (Signed)
Sherri [name] EMERGENCY DEPARTMENT AT [facility] Provider Note   CSN: [id number] Arrival date & time: 04/25/23  1903     History  Chief Complaint  Patient presents with   Back Pain    Sherri [name] is a 32 y.o. female.   Back Pain Patient with low back pain.  Began acutely after waking up from a nap this afternoon.  States when she woke up this morning was fine.  Now pain going down the back of both legs.  Has previous sciatic on the left but normally, on the right.  No injury.  Worse with certain movements.  No relief with over-the-counter medicines.  No dysuria.  No vaginal bleeding or discharge.  No flank pain.  No IV drug use.  No cancer history.  No loss of bladder or bowel control.    Past Medical History:  Diagnosis Date   ADHD    Anemia    Anxiety    Depression    Essential hypertension 10/28/2021   GERD (gastroesophageal reflux disease)    diet controlled   Pelvic pain in female 07/23/2020   Last Assessment & Plan:  Formatting of this note might be different from the original. -- Dyspareunia since 2015 following a LEEP procedure.  -- Describes pain as deep insertional pain. No pain ellicited with oral intercourse. No pain with BMs.  -- On exam, muscle tightness noted on left side of vagina in area of levator ani muscles. Strong sense of discomfort noted with posterior blade of speculu   Stomach ulcer    Vaginal Pap smear, abnormal    2014    Home Medications Prior to Admission medications   Medication Sig Start Date End Date Taking? Authorizing Provider  predniSONE (DELTASONE) 20 MG tablet Take 2 tablets (40 mg total) by mouth daily. 04/25/23  Yes Benjiman Core, MD  amLODipine (NORVASC) 2.5 MG tablet Take 2 tablets (5 mg total) by mouth 2 (two) times daily. 09/01/22   Dulce Sellar, NP  buPROPion ER (WELLBUTRIN SR) 100 MG 12 hr tablet Take 1 tablet (100 mg total) by mouth 2 (two) times daily. 10/31/22   Dulce Sellar, NP  cyclobenzaprine  (FLEXERIL) 5 MG tablet Take 1-2 tablets (5-10 mg total) by mouth 3 (three) times daily as needed (Take at the very start of migraine.). 11/25/22   Dulce Sellar, NP  Multiple Vitamins-Minerals (MULTIVITAMIN WITH MINERALS) tablet Take 1 tablet by mouth daily. 06/05/19   Sciora, Austin Miles, CNM  propranolol (INDERAL) 10 MG tablet Take 1 tablet (10 mg total) by mouth 2 (two) times daily as needed (Anxiety, headache prevention). 10/31/22   Dulce Sellar, NP      Allergies    Amoxicillin, Penicillins, and Mushroom extract complex (do not select)    Review of Systems   Review of Systems  Musculoskeletal:  Positive for back pain.    Physical Exam Updated Vital Signs BP (!) 146/81   Pulse 80   Temp 97.7 F (36.5 C)   Resp 16   SpO2 100%  Physical Exam Vitals and nursing note reviewed.  Cardiovascular:     Rate and Rhythm: Normal rate and regular rhythm.  Pulmonary:     Breath sounds: No wheezing or rhonchi.  Abdominal:     Tenderness: There is no abdominal tenderness.  Musculoskeletal:     Comments: Mild pain with straight leg raise bilaterally.  Good sensation in bilateral feet.  Mild tenderness over SI joints bilaterally.  Neurological:     Mental Status:  She is alert and oriented to person, place, and time.     ED Results / Procedures / Treatments   Labs (all labs ordered are listed, but only abnormal results are displayed) Labs Reviewed  URINALYSIS, ROUTINE W REFLEX MICROSCOPIC - Abnormal; Notable for the following components:      Result Value   Hgb urine dipstick MODERATE (*)    Protein, ur TRACE (*)    All other components within normal limits  PREGNANCY, URINE    EKG None  Radiology No results found.  Procedures Procedures    Medications Ordered in ED Medications  ketorolac (TORADOL) 15 MG/ML injection 15 mg (15 mg Intramuscular Given 04/25/23 2211)    ED Course/ Medical Decision Making/ A&P                                 Medical Decision  Making Amount and/or Complexity of Data Reviewed Labs: ordered.  Risk Prescription drug management.   Patient with bilateral pain going down the back the legs.  Like previous sciatica.  I think most likely is musculoskeletal in origin.  Will give shot of Toradol here and steroids for home.  Outpatient follow-up.  Did have some hematuria.  Has had previously.  Denies menses.  Can follow-up with PCP.  Will discharge home.  Doubt causes such as kidney stone.  Doubt epidural abscess.        Final Clinical Impression(s) / ED Diagnoses Final diagnoses:  Bilateral sciatica    Rx / DC Orders ED Discharge Orders          Ordered    predniSONE (DELTASONE) 20 MG tablet  Daily        04/25/23 2230              Benjiman Core, MD 04/25/23 2254

## 2023-05-01 ENCOUNTER — Other Ambulatory Visit: Payer: Self-pay | Admitting: Family

## 2023-05-01 DIAGNOSIS — I10 Essential (primary) hypertension: Secondary | ICD-10-CM

## 2023-05-29 ENCOUNTER — Ambulatory Visit: Payer: BC Managed Care – PPO | Admitting: Family

## 2023-05-30 ENCOUNTER — Ambulatory Visit: Payer: BC Managed Care – PPO | Admitting: Family

## 2023-05-30 ENCOUNTER — Encounter: Payer: Self-pay | Admitting: Family

## 2023-05-30 VITALS — BP 136/88 | HR 86 | Temp 98.0°F | Ht 65.0 in | Wt 199.1 lb

## 2023-05-30 DIAGNOSIS — G43109 Migraine with aura, not intractable, without status migrainosus: Secondary | ICD-10-CM

## 2023-05-30 DIAGNOSIS — B3731 Acute candidiasis of vulva and vagina: Secondary | ICD-10-CM

## 2023-05-30 DIAGNOSIS — I10 Essential (primary) hypertension: Secondary | ICD-10-CM

## 2023-05-30 DIAGNOSIS — F419 Anxiety disorder, unspecified: Secondary | ICD-10-CM

## 2023-05-30 DIAGNOSIS — F32A Depression, unspecified: Secondary | ICD-10-CM | POA: Diagnosis not present

## 2023-05-30 MED ORDER — FLUCONAZOLE 150 MG PO TABS: ORAL_TABLET | ORAL | 0 refills | Status: DC

## 2023-05-30 MED ORDER — BUPROPION HCL ER (SR) 100 MG PO TB12: 100.0000 mg | ORAL_TABLET | Freq: Two times a day (BID) | ORAL | 1 refills | Status: DC

## 2023-05-30 MED ORDER — AMLODIPINE BESYLATE 2.5 MG PO TABS: 5.0000 mg | ORAL_TABLET | Freq: Two times a day (BID) | ORAL | Status: DC

## 2023-05-30 NOTE — Progress Notes (Signed)
Patient ID: Sherri [name], female    DOB: 06-04-1990, 33 y.o.   MRN: [id number]  Chief Complaint  Patient presents with   Anxiety and Depression       Discussed the use of AI scribe software for clinical note transcription with the patient, who gave verbal consent to proceed.  History of Present Illness   The patient, with a history of hypertension, migraines, and depression, presents for a follow-up visit. She reports good control of her depression on bupropion, which she takes twice daily. She also takes propranolol daily, and amlodipine twice daily for hypertension. She denies any headaches or other side effects from these medications. The patient has been experiencing a persistent yeast infection, which she initially attempted to self-treat with over-the-counter medication. Despite this, she continues to experience itching and a small amount of white discharge. She also reports a recent course of prednisone for sciatica. She also states she has excessive sweating while working, & even during sleep. She notes that she works long hours and wears steel-toed boots, which may contribute to the sweating. She also mentions that she lives with her mother, whose health has recently declined, and that she has to keep the heat up in their home for her comfort.     Assessment & Plan:     Hypertension - Chronic, Stable on Amlodipine 2.5mg  twice daily. No reported side effects. -Continue Amlodipine 2.5mg  twice daily, refill sent. -F/U in 4 mos  Anxiety/Depression - Chronic, Stable on Bupropion 100mg  bid. No reported side effects. -Continue Bupropion twice daily. -Refill Bupropion prescription today. -F/U in 4 mos  Vaginal Candidiasis - Persistent itching and mild discharge despite over-the-counter treatment. Recent use of Prednisone for sciatica may have contributed to infection. -Prescribing Fluconazole, one pill now and one pill in three days. -Advise to keep vaginal/groin area dry (change  underwear prn) and avoid tight clothing. -Recommend use of OTC antifungal powder in groin prn.     Subjective:    Outpatient Medications Prior to Visit  Medication Sig Dispense Refill   amLODipine (NORVASC) 2.5 MG tablet TAKE 2 TABLETS (5 MG TOTAL) BY MOUTH 2 (TWO) TIMES DAILY. 180 tablet 1   buPROPion ER (WELLBUTRIN SR) 100 MG 12 hr tablet Take 1 tablet (100 mg total) by mouth 2 (two) times daily. 60 tablet 5   cyclobenzaprine (FLEXERIL) 5 MG tablet Take 1-2 tablets (5-10 mg total) by mouth 3 (three) times daily as needed (Take at the very start of migraine.). 30 tablet 1   Multiple Vitamins-Minerals (MULTIVITAMIN WITH MINERALS) tablet Take 1 tablet by mouth daily. 100 tablet 0   predniSONE (DELTASONE) 20 MG tablet Take 2 tablets (40 mg total) by mouth daily. (Patient not taking: Reported on 05/30/2023) 6 tablet 0   propranolol (INDERAL) 10 MG tablet Take 1 tablet (10 mg total) by mouth 2 (two) times daily as needed (Anxiety, headache prevention). (Patient not taking: Reported on 05/30/2023) 60 tablet 5   Facility-Administered Medications Prior to Visit  Medication Dose Route Frequency Provider Last Rate Last Admin   medroxyPROGESTERone (DEPO-PROVERA) injection 150 mg  150 mg Intramuscular Q90 days Alberteen Spindle, CNM   150 mg at 04/20/23 1120   Past Medical History:  Diagnosis Date   ADHD    Anemia    Anxiety    Depression    Essential hypertension 10/28/2021   GERD (gastroesophageal reflux disease)    diet controlled   Pelvic pain in female 07/23/2020   Last Assessment & Plan:  Formatting  of this note might be different from the original. -- Dyspareunia since 2015 following a LEEP procedure.  -- Describes pain as deep insertional pain. No pain ellicited with oral intercourse. No pain with BMs.  -- On exam, muscle tightness noted on left side of vagina in area of levator ani muscles. Strong sense of discomfort noted with posterior blade of speculu   Stomach ulcer    Vaginal Pap  smear, abnormal    2014   Past Surgical History:  Procedure Laterality Date   ELBOW SURGERY Left    LEEP     OPEN REDUCTION INTERNAL FIXATION (ORIF) FINGER WITH RADIAL BONE GRAFT Left 02/04/2022   Procedure: CLOSED TREATMENT OF LEFT RING FINGER FRACTURE;  Surgeon: Mack Hook, MD;  Location: [facility] OR;  Service: Orthopedics;  Laterality: Left;  LENGTH OF SURGERY: 45 MINUTES   TONSILLECTOMY     Allergies  Allergen Reactions   Pineapple Swelling   Amoxicillin Hives, Swelling and Rash   Penicillins Hives, Swelling and Rash   Mushroom Extract Complex (Do Not Select) Hives, Swelling and Rash    Pt allergic to MUSHROOMS.      Objective:    Physical Exam Vitals and nursing note reviewed.  Constitutional:      Appearance: Normal appearance.  Cardiovascular:     Rate and Rhythm: Normal rate and regular rhythm.  Pulmonary:     Effort: Pulmonary effort is normal.     Breath sounds: Normal breath sounds.  Musculoskeletal:        General: Normal range of motion.  Skin:    General: Skin is warm and dry.  Neurological:     Mental Status: She is alert.  Psychiatric:        Mood and Affect: Mood normal.        Behavior: Behavior normal.    BP 136/88 (BP Location: Left Arm, Patient Position: Sitting, Cuff Size: Normal)   Pulse 86   Temp 98 F (36.7 C) (Temporal)   Ht 5\' 5"  (1.651 m)   Wt 199 lb 2 oz (90.3 kg)   SpO2 98%   BMI 33.14 kg/m  Wt Readings from Last 3 Encounters:  05/30/23 199 lb 2 oz (90.3 kg)  04/20/23 198 lb 8 oz (90 kg)  03/16/23 200 lb 3.2 oz (90.8 kg)      Dulce Sellar, NP

## 2023-05-30 NOTE — Assessment & Plan Note (Signed)
Chronic, Stable on Amlodipine 2.5mg  twice daily. No reported side effects. -Continue Amlodipine 2.5mg  twice daily, refill sent. -F/U in 4 mos

## 2023-05-30 NOTE — Assessment & Plan Note (Signed)
Stable on Bupropion 100mg  bid. No reported side effects. -Continue Bupropion twice daily. -Refill Bupropion prescription today. -F/U in 4 mos

## 2023-06-14 ENCOUNTER — Other Ambulatory Visit (HOSPITAL_COMMUNITY)
Admission: RE | Admit: 2023-06-14 | Discharge: 2023-06-14 | Disposition: A | Discharge status: Home / Self Care | Payer: BC Managed Care – PPO | Source: Ambulatory Visit | Attending: Family | Admitting: Family

## 2023-06-14 ENCOUNTER — Encounter: Payer: Self-pay | Admitting: Family

## 2023-06-14 ENCOUNTER — Ambulatory Visit: Payer: BC Managed Care – PPO | Admitting: Family

## 2023-06-14 VITALS — BP 158/86 | HR 78 | Temp 97.0°F | Ht 65.0 in | Wt 200.2 lb

## 2023-06-14 DIAGNOSIS — Z113 Encounter for screening for infections with a predominantly sexual mode of transmission: Secondary | ICD-10-CM | POA: Diagnosis not present

## 2023-06-14 DIAGNOSIS — A599 Trichomoniasis, unspecified: Secondary | ICD-10-CM | POA: Diagnosis not present

## 2023-06-14 NOTE — Progress Notes (Signed)
 Patient ID: Sherri [name], female    DOB: 06/24/1990, 33 y.o.   MRN: [id number]  Chief Complaint  Patient presents with   std testing    Pt c/o partner cheating, denies any sx.        Discussed the use of AI scribe software for clinical note transcription with the patient, who gave verbal consent to proceed.  History of Present Illness   Sherri [name] is a 33 year old female who presents for STI screening due to potential exposure. She seeks STI screening after being informed by another woman that her partner has been involved with multiple partners without using protection. She has been in a relationship with him for almost six months and is concerned about the potential risk of exposure due to her partner's behavior. She has not experienced any symptoms suggestive of an STI and has not been informed of any symptoms by her partner or the other woman who contacted her.     Assessment & Plan:     Possible Sexually Transmitted Infection - Unprotected sexual intercourse with a partner who has had multiple other partners. No current symptoms reported. -Order urine tests for chlamydia, gonorrhea, and trichomonas. -Consider HIV testing in a few months due to the window period for seroconversion. -Advised pt to have her own supply of condoms & use consistently for protection.     Subjective:    Outpatient Medications Prior to Visit  Medication Sig Dispense Refill   amLODipine  (NORVASC ) 2.5 MG tablet Take 2 tablets (5 mg total) by mouth 2 (two) times daily.     buPROPion  ER (WELLBUTRIN  SR) 100 MG 12 hr tablet Take 1 tablet (100 mg total) by mouth 2 (two) times daily. 180 tablet 1   cyclobenzaprine  (FLEXERIL ) 5 MG tablet Take 1-2 tablets (5-10 mg total) by mouth 3 (three) times daily as needed (Take at the very start of migraine.). 30 tablet 1   Multiple Vitamins-Minerals (MULTIVITAMIN WITH MINERALS) tablet Take 1 tablet by mouth daily. 100 tablet 0   propranolol  (INDERAL ) 10 MG tablet  Take 1 tablet (10 mg total) by mouth 2 (two) times daily as needed (Anxiety, headache prevention). 60 tablet 5   fluconazole  (DIFLUCAN ) 150 MG tablet Take 1 pill today and the 2nd pill in 3 days. (Patient not taking: Reported on 06/14/2023) 2 tablet 0   Facility-Administered Medications Prior to Visit  Medication Dose Route Frequency Provider Last Rate Last Admin   medroxyPROGESTERone  (DEPO-PROVERA ) injection 150 mg  150 mg Intramuscular Q90 days Waylan Almarie DELENA, CNM   150 mg at 04/20/23 1120   Past Medical History:  Diagnosis Date   ADHD    Anemia    Anxiety    Depression    Essential hypertension 10/28/2021   GERD (gastroesophageal reflux disease)    diet controlled   Pelvic pain in female 07/23/2020   Last Assessment & Plan:  Formatting of this note might be different from the original. -- Dyspareunia since 2015 following a LEEP procedure.  -- Describes pain as deep insertional pain. No pain ellicited with oral intercourse. No pain with BMs.  -- On exam, muscle tightness noted on left side of vagina in area of levator ani muscles. Strong sense of discomfort noted with posterior blade of speculu   Stomach ulcer    Vaginal Pap smear, abnormal    2014   Past Surgical History:  Procedure Laterality Date   ELBOW SURGERY Left    LEEP     OPEN  REDUCTION INTERNAL FIXATION (ORIF) FINGER WITH RADIAL BONE GRAFT Left 02/04/2022   Procedure: CLOSED TREATMENT OF LEFT RING FINGER FRACTURE;  Surgeon: Sebastian Lenis, MD;  Location: [facility] OR;  Service: Orthopedics;  Laterality: Left;  LENGTH OF SURGERY: 45 MINUTES   TONSILLECTOMY     Allergies  Allergen Reactions   Pineapple Swelling   Amoxicillin Hives, Swelling and Rash   Penicillins Hives, Swelling and Rash   Mushroom Extract Complex (Do Not Select) Hives, Swelling and Rash    Pt allergic to MUSHROOMS.      Objective:    Physical Exam Vitals and nursing note reviewed.  Constitutional:      Appearance: Normal appearance.   Cardiovascular:     Rate and Rhythm: Normal rate and regular rhythm.  Pulmonary:     Effort: Pulmonary effort is normal.     Breath sounds: Normal breath sounds.  Musculoskeletal:        General: Normal range of motion.  Skin:    General: Skin is warm and dry.  Neurological:     Mental Status: She is alert.  Psychiatric:        Mood and Affect: Mood normal.        Behavior: Behavior normal.    BP (!) 158/86 (BP Location: Left Arm, Patient Position: Sitting)   Pulse 78   Temp (!) 97 F (36.1 C) (Temporal)   Ht 5' 5 (1.651 m)   Wt 200 lb 4 oz (90.8 kg)   SpO2 100%   BMI 33.32 kg/m  Wt Readings from Last 3 Encounters:  06/14/23 200 lb 4 oz (90.8 kg)  05/30/23 199 lb 2 oz (90.3 kg)  04/20/23 198 lb 8 oz (90 kg)       Lucius Krabbe, NP

## 2023-06-15 LAB — URINE CYTOLOGY ANCILLARY ONLY
Chlamydia: NEGATIVE
Comment: NEGATIVE
Comment: NEGATIVE
Comment: NORMAL
Neisseria Gonorrhea: NEGATIVE
Trichomonas: POSITIVE — AB

## 2023-06-16 ENCOUNTER — Encounter: Payer: Self-pay | Admitting: Family

## 2023-06-16 MED ORDER — METRONIDAZOLE 500 MG PO TABS: 500.0000 mg | ORAL_TABLET | Freq: Two times a day (BID) | ORAL | 0 refills | Status: AC

## 2023-06-16 NOTE — Addendum Note (Signed)
 Addended by: Camilla Skeen on: 06/16/2023 04:15 PM   Modules accepted: Orders

## 2023-06-27 ENCOUNTER — Ambulatory Visit: Payer: BC Managed Care – PPO | Admitting: Family

## 2023-06-27 ENCOUNTER — Encounter: Payer: Self-pay | Admitting: Family

## 2023-06-27 DIAGNOSIS — F32A Depression, unspecified: Secondary | ICD-10-CM

## 2023-06-27 DIAGNOSIS — F419 Anxiety disorder, unspecified: Secondary | ICD-10-CM

## 2023-06-27 DIAGNOSIS — I10 Essential (primary) hypertension: Secondary | ICD-10-CM

## 2023-06-27 MED ORDER — AMLODIPINE BESYLATE 2.5 MG PO TABS: 5.0000 mg | ORAL_TABLET | Freq: Two times a day (BID) | ORAL | 1 refills | Status: DC

## 2023-06-27 NOTE — Progress Notes (Signed)
Patient ID: Sherri [name], female    DOB: 1991-01-03, 34 y.o.   MRN: [id number]  Chief Complaint  Patient presents with   Hypertension       Discussed the use of AI scribe software for clinical note transcription with the patient, who gave verbal consent to proceed.  History of Present Illness   Sherri [name] is a 33 year old female with hypertension who presents for a routine follow-up visit. Her blood pressure is reportedly well-controlled without significant fluctuations or symptoms related to her hypertension. She continues to take amlodipine 2.5mg  bid and propranolol, with propranolol being taken once daily, mostly for anxiety. Additionally, she is on bupropion, which was recently refilled during her last visit. Her last laboratory tests conducted in June showed slightly elevated cholesterol levels, and she is working on reducing cholesterol in her diet.         Assessment & Plan:     Hypertension - Well controlled on current regimen of Amlodipine and Propranolol. -Continue Amlodipine and Propranolol as prescribed. -Refill Amlodipine prescription today. -F/U in 6 mos  Hyperlipidemia - Slightly elevated cholesterol noted in June 2024. -Plan to recheck lipid panel in August 2025.      Subjective:    Outpatient Medications Prior to Visit  Medication Sig Dispense Refill   amLODipine (NORVASC) 2.5 MG tablet Take 2 tablets (5 mg total) by mouth 2 (two) times daily.     buPROPion ER (WELLBUTRIN SR) 100 MG 12 hr tablet Take 1 tablet (100 mg total) by mouth 2 (two) times daily. 180 tablet 1   Multiple Vitamins-Minerals (MULTIVITAMIN WITH MINERALS) tablet Take 1 tablet by mouth daily. 100 tablet 0   propranolol (INDERAL) 10 MG tablet Take 1 tablet (10 mg total) by mouth 2 (two) times daily as needed (Anxiety, headache prevention). 60 tablet 5   cyclobenzaprine (FLEXERIL) 5 MG tablet Take 1-2 tablets (5-10 mg total) by mouth 3 (three) times daily as needed (Take at the very  start of migraine.). (Patient not taking: Reported on 06/27/2023) 30 tablet 1   fluconazole (DIFLUCAN) 150 MG tablet Take 1 pill today and the 2nd pill in 3 days. (Patient not taking: Reported on 06/27/2023) 2 tablet 0   Facility-Administered Medications Prior to Visit  Medication Dose Route Frequency Provider Last Rate Last Admin   medroxyPROGESTERone (DEPO-PROVERA) injection 150 mg  150 mg Intramuscular Q90 days Alberteen Spindle, CNM   150 mg at 04/20/23 1120   Past Medical History:  Diagnosis Date   ADHD    Anemia    Anxiety    Depression    Essential hypertension 10/28/2021   GERD (gastroesophageal reflux disease)    diet controlled   Pelvic pain in female 07/23/2020   Last Assessment & Plan:  Formatting of this note might be different from the original. -- Dyspareunia since 2015 following a LEEP procedure.  -- Describes pain as deep insertional pain. No pain ellicited with oral intercourse. No pain with BMs.  -- On exam, muscle tightness noted on left side of vagina in area of levator ani muscles. Strong sense of discomfort noted with posterior blade of speculu   Stomach ulcer    Vaginal Pap smear, abnormal    2014   Past Surgical History:  Procedure Laterality Date   ELBOW SURGERY Left    LEEP     OPEN REDUCTION INTERNAL FIXATION (ORIF) FINGER WITH RADIAL BONE GRAFT Left 02/04/2022   Procedure: CLOSED TREATMENT OF LEFT RING FINGER FRACTURE;  Surgeon:  Mack Hook, MD;  Location: [facility] OR;  Service: Orthopedics;  Laterality: Left;  LENGTH OF SURGERY: 45 MINUTES   TONSILLECTOMY     Allergies  Allergen Reactions   Pineapple Swelling   Amoxicillin Hives, Swelling and Rash   Penicillins Hives, Swelling and Rash   Mushroom Extract Complex (Obsolete) Hives, Swelling and Rash    Pt allergic to MUSHROOMS.      Objective:    Physical Exam Vitals and nursing note reviewed.  Constitutional:      Appearance: Normal appearance. She is obese.  Cardiovascular:     Rate and Rhythm:  Normal rate and regular rhythm.  Pulmonary:     Effort: Pulmonary effort is normal.     Breath sounds: Normal breath sounds.  Musculoskeletal:        General: Normal range of motion.  Skin:    General: Skin is warm and dry.  Neurological:     Mental Status: She is alert.  Psychiatric:        Mood and Affect: Mood normal.        Behavior: Behavior normal.    BP 121/80 (BP Location: Left Arm, Patient Position: Sitting, Cuff Size: Normal)   Pulse 84   Temp 98 F (36.7 C) (Temporal)   Ht 5\' 5"  (1.651 m)   Wt 203 lb 3.2 oz (92.2 kg)   SpO2 100%   BMI 33.81 kg/m  Wt Readings from Last 3 Encounters:  06/27/23 203 lb 3.2 oz (92.2 kg)  06/14/23 200 lb 4 oz (90.8 kg)  05/30/23 199 lb 2 oz (90.3 kg)      Sherri Sellar, NP

## 2023-06-27 NOTE — Assessment & Plan Note (Signed)
Well controlled on current regimen of Amlodipine and Propranolol. -Continue Amlodipine and Propranolol as prescribed. -Refill Amlodipine prescription today. -F/U in 6 mos

## 2023-07-12 ENCOUNTER — Ambulatory Visit: Payer: Self-pay

## 2023-07-17 ENCOUNTER — Ambulatory Visit (LOCAL_COMMUNITY_HEALTH_CENTER): Payer: Self-pay

## 2023-07-17 ENCOUNTER — Ambulatory Visit: Payer: Self-pay

## 2023-07-17 VITALS — BP 138/75 | Ht 65.0 in | Wt 203.5 lb

## 2023-07-17 DIAGNOSIS — Z3009 Encounter for other general counseling and advice on contraception: Secondary | ICD-10-CM

## 2023-07-17 DIAGNOSIS — Z308 Encounter for other contraceptive management: Secondary | ICD-10-CM

## 2023-07-17 DIAGNOSIS — Z30013 Encounter for initial prescription of injectable contraceptive: Secondary | ICD-10-CM | POA: Diagnosis not present

## 2023-07-17 DIAGNOSIS — Z3042 Encounter for surveillance of injectable contraceptive: Secondary | ICD-10-CM

## 2023-07-17 NOTE — Progress Notes (Signed)
 12 Weeks   4 Days since last Depo    Voices no concerns today.  Counseled to adhere to 11 to 13 week intervals between depo injections for optimal benefit.  Depo given today per order by Hazle Coca, CNM  dated 10/21/2022.  Tolerated well LUOQ. Marland Kitchen  Next depo due 10/02/2023 has reminder card.  Jerel Shepherd, RN

## 2023-10-31 ENCOUNTER — Ambulatory Visit: Payer: Self-pay

## 2023-11-07 ENCOUNTER — Ambulatory Visit: Payer: Self-pay | Admitting: Family Medicine

## 2023-11-07 ENCOUNTER — Encounter: Payer: Self-pay | Admitting: Family Medicine

## 2023-11-07 VITALS — BP 145/89 | HR 82 | Ht 65.0 in | Wt 203.0 lb

## 2023-11-07 DIAGNOSIS — Z30013 Encounter for initial prescription of injectable contraceptive: Secondary | ICD-10-CM | POA: Diagnosis not present

## 2023-11-07 DIAGNOSIS — Z308 Encounter for other contraceptive management: Secondary | ICD-10-CM | POA: Diagnosis not present

## 2023-11-07 DIAGNOSIS — Z01419 Encounter for gynecological examination (general) (routine) without abnormal findings: Secondary | ICD-10-CM

## 2023-11-07 DIAGNOSIS — Z3009 Encounter for other general counseling and advice on contraception: Secondary | ICD-10-CM

## 2023-11-07 DIAGNOSIS — O3440 Maternal care for other abnormalities of cervix, unspecified trimester: Secondary | ICD-10-CM

## 2023-11-07 DIAGNOSIS — Z113 Encounter for screening for infections with a predominantly sexual mode of transmission: Secondary | ICD-10-CM

## 2023-11-07 LAB — WET PREP FOR TRICH, YEAST, CLUE
Clue Cell Exam: NEGATIVE
Trichomonas Exam: NEGATIVE
Yeast Exam: NEGATIVE

## 2023-11-07 LAB — HM HIV SCREENING LAB: HM HIV Screening: NEGATIVE

## 2023-11-07 MED ORDER — MEDROXYPROGESTERONE ACETATE 150 MG/ML IM SUSP
150.0000 mg | INTRAMUSCULAR | Status: AC
Start: 2023-11-07 — End: 2025-01-30
  Administered 2023-11-07 – 2024-06-04 (×3): 150 mg via INTRAMUSCULAR

## 2023-11-07 NOTE — Progress Notes (Signed)
 Pt here for annual physical, pap smear and Depo injection.  Wet mount results reviewed with patient. No treatment needed at this time.  Depo Provera  150mg  IM given in LUOQ without difficulty.  Reminder card given for next injection.  Condoms declined.-Romeka Scifres, RN

## 2023-11-07 NOTE — Progress Notes (Signed)
 [employer] HE[facility] DEPARTMENT [facility] [address], [address] [place] [address] Main phone: [phone]  Family Planning Visit - Repeat Yearly Visit  Subjective:  Sherri [name] is a 33 y.o. G0P0000  being seen today for an annual wellness visit and to discuss contraception options. The patient is currently using hormonal injection for pregnancy prevention. Patient does not want a pregnancy in the next year.   Patient reports they are looking for a method with the following characteristics:  High efficacy at preventing pregnancy  Patient has the following medical problems:  Patient Active Problem List   Diagnosis Date Noted   Migraine with aura and without status migrainosus, not intractable 11/25/2022   Vapes nicotine containing substance 10/21/2022   Need for HPV vaccination 05/31/2022   Anxiety and depression 10/28/2021   Essential hypertension 10/28/2021   Hx of LEEP (loop electrosurgical excision procedure) 7/ 2015 [facility] 09/02/2019   Obesity BMI=31.8 08/30/2019   Marijuana use 08/30/2019   CIN III (cervical intraepithelial neoplasia III) 03/06/13 03/06/2013   Chief Complaint  Patient presents with   Annual Exam    Physical and Depo    HPI Patient reports to clinic for PE and to renew depo prescription  Patient denies concerns about self    Review of Systems  Constitutional:  Negative for weight loss.  Eyes:  Negative for blurred vision.  Respiratory:  Negative for cough and shortness of breath.   Cardiovascular:  Negative for claudication.  Gastrointestinal:  Negative for nausea.  Genitourinary:  Negative for dysuria and frequency.  Skin:  Negative for rash.  Neurological:  Negative for headaches.  Endo/Heme/Allergies:  Does not bruise/bleed easily.    See flowsheet for further details and programmatic requirements Hyperlink available at the top of the signed note in blue.  Flow sheet content below:  Pregnancy Intention  Screening Does the patient want to become pregnant in the next year?: No Does the patient's partner want to become pregnant in the next year?: No Would the patient like to discuss contraceptive options today?: Yes Results Follow up Password: [email or web address] Is it okay to contact you by mail?: Yes Contraception History Past methods of contraception used by patient:: Hormonal Injection Adverse effects associated with Hormonal Injection: none Sexual History What age did you start your period?: 12 How often do you have your period?: no menses with depo Date of last sex?:  (March 2025) Has the patient had unprotected sex within the last 5 days?: No Do you have sex with men, women, both men and women?: Men only In the past 2 months how many partners have you had sex with?: 0 In the past 12 months, how many partners have you had sex with?: 1 Is it possible that any of your sex partners in the past 12 months had sex with someone else whild they were still in a sexual relationship with you?: No What ways do you have sex?: Vaginal, Oral Do you or your partner use condoms and/or dental dams every time you have vaginal, oral or anal sex?: Yes Do you douche?: No Date of last HIV test?: 10/21/22 Have you ever had an STD?: Yes Have any of your partners had an STD?: Yes Partner Previous STD?: Trichomonas Date?:  (2021) Have you or your partner ever shot up drugs?: No Have any of your partners used drugs in the past?: No Have you or your partners exchanged money or drugs for sex?: No  Diabetes screening This patient is 33 y.o.  with a BMI of Body mass index is 33.78 kg/m.Sherri [name]  Is patient eligible for diabetes screening (age >35 and BMI >25)?  no  Was Hgb A1c ordered? not applicable  STI screening Patient reports 1 of partners in last year.  Does this patient desire STI screening?  Yes  Hepatitis C screening Has patient been screened once for HCV in the past?  No  No results found for: HCVAB  Does the  patient meet criteria for HCV testing? No  (If yes-- Screen for HCV through [facility]) Criteria:  Since the last HCV result, does the patient have any of the following? - Current drug use - Have a partner with drug use - Has been incarcerated  Hepatitis B screening Does the patient meet criteria for HBV testing? No Criteria:  -Household, sexual or needle sharing contact with HBV -History of drug use -HIV positive -Those with known Hep C  Cervical Cancer Screening  Result Date Procedure Results Follow-ups  10/21/2022 IGP, Aptima HPV DIAGNOSIS:: Comment Specimen adequacy:: Comment Clinician Provided ICD10: Comment Performed by:: Comment PAP Smear Comment: . Note:: Comment Test Methodology: Comment HPV Aptima: Positive (A)   08/30/2019 IGP, rfx Aptima HPV ASCU DIAGNOSIS:: Comment Specimen adequacy:: Comment Clinician Provided ICD10: Comment Performed by:: Comment PAP Smear Comment: . Note:: Comment Test Methodology: Comment PAP Reflex: Comment   06/14/2016 HM PAP SMEAR HM Pap smear: NIL, HPV negative     Health Maintenance Due  Topic Date Due   Cervical Cancer Screening (Pap smear)  10/21/2023    The following portions of the patient's history were reviewed and updated as appropriate: allergies, current medications, past family history, past medical history, past social history, past surgical history and problem list. Problem list updated.  Objective:   Vitals:   11/07/23 0824  BP: (!) 145/89  Pulse: 82  Weight: 203 lb (92.1 kg)  Height: 5' 5 (1.651 m)    Physical Exam Vitals and nursing note reviewed. Exam conducted with a chaperone present Sherri [name] [facility]).  Constitutional:      Appearance: Normal appearance.  HENT:     Head: Normocephalic and atraumatic.     Mouth/Throat:     Mouth: Mucous membranes are moist.     Pharynx: Oropharynx is clear. No oropharyngeal exudate or posterior oropharyngeal erythema.  Pulmonary:     Effort: Pulmonary effort is  normal.  Abdominal:     General: Abdomen is flat.     Palpations: There is no mass.     Tenderness: There is no abdominal tenderness. There is no rebound.  Genitourinary:    General: Normal vulva.     Exam position: Lithotomy position.     Pubic Area: No rash or pubic lice.      Labia:        Right: No rash or lesion.        Left: No rash or lesion.      Vagina: Normal. No vaginal discharge, erythema, bleeding or lesions.     Cervix: No cervical motion tenderness, discharge, friability, lesion or erythema.     Uterus: Normal.      Adnexa: Right adnexa normal and left adnexa normal.     Rectum: Normal.     Comments: pH = 4 Lymphadenopathy:     Head:     Right side of head: No preauricular or posterior auricular adenopathy.     Left side of head: No preauricular or posterior auricular adenopathy.     Cervical: No cervical adenopathy.  Upper Body:     Right upper body: No supraclavicular, axillary or epitrochlear adenopathy.     Left upper body: No supraclavicular, axillary or epitrochlear adenopathy.     Lower Body: No right inguinal adenopathy. No left inguinal adenopathy.   Skin:    General: Skin is warm and dry.     Findings: No rash.   Neurological:     Mental Status: She is alert and oriented to person, place, and time.     Assessment and Plan:  Sherri [name] is a 33 y.o. female G0P0000 presenting to the [facility] Department for an yearly wellness and contraception visit  1. Family planning (Primary) Contraception counseling:  Reviewed options based on patient desire and reproductive life plan. Patient is interested in Hormonal Injection. This was provided to the patient today.   Risks, benefits, and typical effectiveness rates were reviewed.  Questions were answered.  Written information was also given to the patient to review.    The patient will follow up in  3 months for surveillance.  The patient was told to call with any further questions, or  with any concerns about this method of contraception.  Emphasized use of condoms 100% of the time for STI prevention.  Emergency Contraception Precautions (ECP): Patient assessed for need of ECP. She is not a candidate based on no intercourse since LMP.  Educated on ECP and reviewed options.  Patient desires no method - patient politely declines any emergency contraception.   - medroxyPROGESTERone  (DEPO-PROVERA ) injection 150 mg  2. Well woman exam with routine gynecological exam -last CBE done 10/2022 (normal), next due 10/2025- no concerns today -has HTN- just took meds prior to appt- has someone following for her HTN and anxiety/depression  3. Screening for venereal disease  - Chlamydia/Gonorrhea [facility] - HIV [facility] - Syphilis Serology, [facility] - WET PREP FOR TRICH, YEAST, CLUE  4. Hx of LEEP (loop electrosurgical excision procedure) 7/ 2015 [facility] -reviewed hx- unclear if colpo should have been done after last pap smear which resulted in NILM/HPV+, will see what this pap shows today d - IGP, Aptima HPV   No follow-ups on file.  No future appointments.  Verneta Bers, [place]

## 2023-11-10 LAB — IGP, APTIMA HPV
HPV Aptima: POSITIVE — AB
PAP Smear Comment: 0

## 2023-11-13 ENCOUNTER — Ambulatory Visit: Payer: Self-pay | Admitting: Family Medicine

## 2023-11-13 ENCOUNTER — Telehealth: Payer: Self-pay

## 2023-11-13 ENCOUNTER — Encounter: Payer: Self-pay | Admitting: Family Medicine

## 2023-11-13 DIAGNOSIS — B3731 Acute candidiasis of vulva and vagina: Secondary | ICD-10-CM

## 2023-11-13 NOTE — Telephone Encounter (Signed)
 Call pt re pap results from 11/07/23 specimen and colpo referral. NIL, positive HPV  Confirm insurance and where pt wants to go for colpo.

## 2023-11-16 ENCOUNTER — Encounter: Payer: Self-pay | Admitting: Family Medicine

## 2023-11-17 NOTE — Telephone Encounter (Signed)
 Phone call to pt at [phone]. Pt answered and confirmed password. Counseled re pap results and colpo referral.  Pt lives in [place] but does not have a specific OBGYN or known provider in mind for colpo.  Stated travel to [place] is fine and okay to send to [facility].

## 2023-11-17 NOTE — Telephone Encounter (Signed)
 Phone call to pt at [phone]. Mailbox full, unable to leave message. Tried twice. Pt picked up on 2nd call, stated she is at work, asked if she could call back in about 10 min, tried to give Ext (sounded like background noise). Pt said I could call back in about 20 min.

## 2023-11-17 NOTE — Telephone Encounter (Signed)
 11/17/23 Colpo referral sent to [facility] through Epic's Referrals tab.

## 2023-11-21 MED ORDER — FLUCONAZOLE 150 MG PO TABS: ORAL_TABLET | ORAL | 0 refills | Status: DC

## 2023-12-13 NOTE — Telephone Encounter (Addendum)
 Phone call to pt at [phone] re colpo referral. Pt answered and confirmed identity. Status appears that referral went through and [facility] has tried to contact her once. Pt did not recall contact from [facility]. Not sure if the correct number was dialed or if they left a message. Provided [facility] number for her to call them directly, [phone].

## 2023-12-20 ENCOUNTER — Ambulatory Visit: Admitting: Family

## 2023-12-22 ENCOUNTER — Ambulatory Visit: Admitting: Family

## 2023-12-25 ENCOUNTER — Ambulatory Visit: Payer: BC Managed Care – PPO | Admitting: Family

## 2023-12-27 ENCOUNTER — Ambulatory Visit: Admitting: Family

## 2024-01-09 ENCOUNTER — Ambulatory Visit (INDEPENDENT_AMBULATORY_CARE_PROVIDER_SITE_OTHER): Admitting: Family

## 2024-01-09 VITALS — BP 136/87 | HR 70 | Temp 98.0°F | Ht 65.0 in | Wt 206.2 lb

## 2024-01-09 DIAGNOSIS — F419 Anxiety disorder, unspecified: Secondary | ICD-10-CM | POA: Diagnosis not present

## 2024-01-09 DIAGNOSIS — R87618 Other abnormal cytological findings on specimens from cervix uteri: Secondary | ICD-10-CM | POA: Diagnosis not present

## 2024-01-09 DIAGNOSIS — I10 Essential (primary) hypertension: Secondary | ICD-10-CM

## 2024-01-09 DIAGNOSIS — F32A Depression, unspecified: Secondary | ICD-10-CM

## 2024-01-09 LAB — COMPREHENSIVE METABOLIC PANEL WITH GFR
ALT: 12 U/L (ref 0–35)
AST: 12 U/L (ref 0–37)
Albumin: 4.3 g/dL (ref 3.5–5.2)
Alkaline Phosphatase: 73 U/L (ref 39–117)
BUN: 12 mg/dL (ref 6–23)
CO2: 22 meq/L (ref 19–32)
Calcium: 9.2 mg/dL (ref 8.4–10.5)
Chloride: 108 meq/L (ref 96–112)
Creatinine, Ser: 0.76 mg/dL (ref 0.40–1.20)
GFR: 103.35 mL/min (ref >60.00)
Glucose, Bld: 104 mg/dL — ABNORMAL HIGH (ref 70–99)
Potassium: 3.8 meq/L (ref 3.5–5.1)
Sodium: 142 meq/L (ref 135–145)
Total Bilirubin: 0.3 mg/dL (ref 0.2–1.2)
Total Protein: 7.5 g/dL (ref 6.0–8.3)

## 2024-01-09 MED ORDER — AMLODIPINE BESYLATE 2.5 MG PO TABS
5.0000 mg | ORAL_TABLET | Freq: Two times a day (BID) | ORAL | 1 refills | Status: AC
Start: 2024-01-09 — End: ?

## 2024-01-09 MED ORDER — BUPROPION HCL ER (SR) 100 MG PO TB12: 100.0000 mg | ORAL_TABLET | Freq: Two times a day (BID) | ORAL | 1 refills | Status: AC

## 2024-01-09 NOTE — Assessment & Plan Note (Signed)
 Symptoms managed with Bupropion  100mg  q12h. - Continue Bupropion  bid, sending refill. - F/U in 6 mos

## 2024-01-09 NOTE — Progress Notes (Signed)
 Patient ID: Sherri [name], female    DOB: [birth year], 33 y.o.   MRN: [id number]  Chief Complaint  Patient presents with   Hypertension    Non fasting w/ labs  Discussed the use of AI scribe software for clinical note transcription with the patient, who gave verbal consent to proceed.  History of Present Illness   Sherri [name] is a 33 year old female with hypertension who presents for medication management and follow-up.  Hypertension - Currently taking amlodipine  2.5 mg twice daily for blood pressure control. - No mention of adverse effects from antihypertensive therapy.  Headache and anxiety symptoms - Uses propranolol  as needed for headache and anxiety symptoms. - No mention of current headache or anxiety symptoms at this visit.  Fatigue - Working two shifts, resulting in fatigue that is described as tiring but manageable.  Abnormal cervical cytology - Recent Pap smear positive for HPV with no evidence of malignant cells. - Seeking follow-up with an OB-GYN in [place] after missing a previous appointment in [place].       Assessment & Plan:     Essential hypertension Blood pressure well-managed with current medication. - Continue amlodipine  2.5 mg twice daily. - Order metabolic panel to monitor kidney and liver function. - F/U in 6 mos  Anxiety/Depression disorder Symptoms managed with Bupropion  100mg  q12h. - Continue Bupropion  bid, sending refill. - F/U in 6 mos  Abnormal Pap smear with positive HPV, no cancer cells found Abnormal Pap smear with positive HPV, no cancer cells via [place] health dept. Difficulty finding OBGYN in [place]? Provided appt in [place], pt prefers [place]. - Refer to an OBGYN in [place] for further evaluation. - Follow up by Friday if no contact from OBGYN office.     Subjective:    Outpatient Medications Prior to Visit  Medication Sig Dispense Refill   amLODipine  (NORVASC ) 2.5 MG tablet Take 2 tablets (5 mg total) by  mouth 2 (two) times daily. 90 tablet 1   buPROPion  ER (WELLBUTRIN  SR) 100 MG 12 hr tablet Take 1 tablet (100 mg total) by mouth 2 (two) times daily. 180 tablet 1   medroxyPROGESTERone  (DEPO-PROVERA ) 150 MG/ML injection Inject 150 mg into the muscle every 3 (three) months.     Multiple Vitamins-Minerals (MULTIVITAMIN WITH MINERALS) tablet Take 1 tablet by mouth daily. 100 tablet 0   propranolol  (INDERAL ) 10 MG tablet Take 1 tablet (10 mg total) by mouth 2 (two) times daily as needed (Anxiety, headache prevention). 60 tablet 5   cyclobenzaprine  (FLEXERIL ) 5 MG tablet Take 1-2 tablets (5-10 mg total) by mouth 3 (three) times daily as needed (Take at the very start of migraine.). (Patient not taking: Reported on 06/27/2023) 30 tablet 1   fluconazole  (DIFLUCAN ) 150 MG tablet Take 1 pill today and the 2nd pill in 3 days. 2 tablet 0   Facility-Administered Medications Prior to Visit  Medication Dose Route Frequency Provider Last Rate Last Admin   medroxyPROGESTERone  (DEPO-PROVERA ) injection 150 mg  150 mg Intramuscular Q90 days    150 mg at 11/07/23 9056   Past Medical History:  Diagnosis Date   ADHD    Anemia    Anxiety    Depression    Essential hypertension 10/28/2021   GERD (gastroesophageal reflux disease)    diet controlled   Pelvic pain in female 07/23/2020   Last Assessment & Plan:  Formatting of this note might be different from the original. -- Dyspareunia since 2015 following a LEEP procedure.  -- Describes  pain as deep insertional pain. No pain ellicited with oral intercourse. No pain with BMs.  -- On exam, muscle tightness noted on left side of vagina in area of levator ani muscles. Strong sense of discomfort noted with posterior blade of speculu   Stomach ulcer    Vaginal Pap smear, abnormal    2014   Past Surgical History:  Procedure Laterality Date   ELBOW SURGERY Left    LEEP     OPEN REDUCTION INTERNAL FIXATION (ORIF) FINGER WITH RADIAL BONE GRAFT Left 02/04/2022   Procedure:  CLOSED TREATMENT OF LEFT RING FINGER FRACTURE;  Surgeon: Sebastian Lenis, MD;  Location: [facility] OR;  Service: Orthopedics;  Laterality: Left;  LENGTH OF SURGERY: 45 MINUTES   TONSILLECTOMY     Allergies  Allergen Reactions   Pineapple Swelling   Amoxicillin Hives, Swelling and Rash   Penicillins Hives, Swelling and Rash   Mushroom Extract Complex (Obsolete) Hives, Swelling and Rash    Pt allergic to MUSHROOMS.      Objective:    Physical Exam Vitals and nursing note reviewed.  Constitutional:      Appearance: Normal appearance. She is obese.  Cardiovascular:     Rate and Rhythm: Normal rate and regular rhythm.  Pulmonary:     Effort: Pulmonary effort is normal.     Breath sounds: Normal breath sounds.  Musculoskeletal:        General: Normal range of motion.  Skin:    General: Skin is warm and dry.  Neurological:     Mental Status: She is alert.  Psychiatric:        Mood and Affect: Mood normal.        Behavior: Behavior normal.    BP 136/87 (BP Location: Left Arm, Patient Position: Sitting, Cuff Size: Large)   Pulse 70   Temp 98 F (36.7 C) (Temporal)   Ht 5' 5 (1.651 m)   Wt 206 lb 3.2 oz (93.5 kg)   LMP  (LMP Unknown)   SpO2 100%   BMI 34.31 kg/m  Wt Readings from Last 3 Encounters:  01/09/24 206 lb 3.2 oz (93.5 kg)  11/07/23 203 lb (92.1 kg)  07/17/23 203 lb 8 oz (92.3 kg)      Lucius Krabbe, NP

## 2024-01-09 NOTE — Assessment & Plan Note (Signed)
 Blood pressure well-managed with current medication. - Continue amlodipine  2.5 mg twice daily. - Order metabolic panel to monitor kidney and liver function. - F/U in 6 mos

## 2024-01-10 ENCOUNTER — Ambulatory Visit: Payer: Self-pay | Admitting: Family

## 2024-02-06 NOTE — Progress Notes (Signed)
 Colpo referral originally sent to [facility]. Pt did not schedule an appt with [facility]. ----------  01/09/24 - Pt seen by [facility] at [facility] Addressed abnormal pap.  Referral to [facility] re abnormal PAP and + HPV.

## 2024-02-22 ENCOUNTER — Ambulatory Visit

## 2024-02-27 ENCOUNTER — Ambulatory Visit

## 2024-02-27 VITALS — BP 130/86 | HR 65 | Ht 65.0 in | Wt 205.5 lb

## 2024-02-27 DIAGNOSIS — Z3009 Encounter for other general counseling and advice on contraception: Secondary | ICD-10-CM

## 2024-02-27 DIAGNOSIS — Z30013 Encounter for initial prescription of injectable contraceptive: Secondary | ICD-10-CM | POA: Diagnosis not present

## 2024-02-27 DIAGNOSIS — Z3042 Encounter for surveillance of injectable contraceptive: Secondary | ICD-10-CM

## 2024-02-27 DIAGNOSIS — Z308 Encounter for other contraceptive management: Secondary | ICD-10-CM | POA: Diagnosis not present

## 2024-02-27 NOTE — Progress Notes (Unsigned)
 Client presents for Depo injection 16 weeks post last dose. Depo administered today per order of [facility] FNP. Client counseled Depo most effective when administered every 11 - 13 weeks and administering in that time frame decreases chance of irregular bleeding. Depo due date reminder card given to client. Burnadette Lowers, RN

## 2024-03-18 ENCOUNTER — Telehealth: Admitting: Physician Assistant

## 2024-03-18 DIAGNOSIS — J019 Acute sinusitis, unspecified: Secondary | ICD-10-CM | POA: Diagnosis not present

## 2024-03-18 DIAGNOSIS — B9689 Other specified bacterial agents as the cause of diseases classified elsewhere: Secondary | ICD-10-CM

## 2024-03-18 MED ORDER — DOXYCYCLINE HYCLATE 100 MG PO TABS: 100.0000 mg | ORAL_TABLET | Freq: Two times a day (BID) | ORAL | 0 refills | Status: DC

## 2024-03-18 MED ORDER — FLUTICASONE PROPIONATE 50 MCG/ACT NA SUSP: 2.0000 | Freq: Every day | NASAL | 0 refills | Status: DC

## 2024-03-18 NOTE — Progress Notes (Signed)
 Virtual Visit Consent   Sherri [name], you are scheduled for a virtual visit with a [facility] provider today. Just as with appointments in the office, your consent must be obtained to participate. Your consent will be active for this visit and any virtual visit you may have with one of our providers in the next 365 days. If you have a MyChart account, a copy of this consent can be sent to you electronically.  As this is a virtual visit, video technology does not allow for your provider to perform a traditional examination. This may limit your provider's ability to fully assess your condition. If your provider identifies any concerns that need to be evaluated in person or the need to arrange testing (such as labs, EKG, etc.), we will make arrangements to do so. Although advances in technology are sophisticated, we cannot ensure that it will always work on either your end or our end. If the connection with a video visit is poor, the visit may have to be switched to a telephone visit. With either a video or telephone visit, we are not always able to ensure that we have a secure connection.  By engaging in this virtual visit, you consent to the provision of healthcare and authorize for your insurance to be billed (if applicable) for the services provided during this visit. Depending on your insurance coverage, you may receive a charge related to this service.  I need to obtain your verbal consent now. Are you willing to proceed with your visit today? Sherri [name] has provided verbal consent on 03/18/2024 for a virtual visit (video or telephone). Delon CHRISTELLA Dickinson, PA-C  Date: 03/18/2024 2:36 PM   Virtual Visit via Video Note   I, Delon CHRISTELLA Dickinson, connected with  Sherri [name]  ([id number], 11-05-1990) on 03/18/24 at  2:00 PM EST by a video-enabled telemedicine application and verified that I am speaking with the correct person using two identifiers.  Location: Patient: Virtual Visit  Location Patient: Home Provider: Virtual Visit Location Provider: Home Office   I discussed the limitations of evaluation and management by telemedicine and the availability of in person appointments. The patient expressed understanding and agreed to proceed.    History of Present Illness: Sherri [name] is a 33 y.o. who identifies as a female who was assigned female at birth, and is being seen today for sinus congestion and ear congestion.  HPI: Sinusitis This is a new problem. The current episode started in the past 7 days. The problem has been gradually worsening since onset. There has been no fever. Associated symptoms include chills, congestion, ear pain, headaches, sinus pressure, sneezing and a sore throat. Pertinent negatives include no coughing or shortness of breath. Treatments tried: antihistamine, mucinex. The treatment provided no relief.   3 at home Covid test were all negative   Problems:  Patient Active Problem List   Diagnosis Date Noted   Migraine with aura and without status migrainosus, not intractable 11/25/2022   Vapes nicotine containing substance 10/21/2022   Need for HPV vaccination 05/31/2022   Anxiety and depression 10/28/2021   Essential hypertension 10/28/2021   Hx of LEEP (loop electrosurgical excision procedure) 7/ 2015 [facility] 09/02/2019   Obesity BMI=31.8 08/30/2019   Marijuana use 08/30/2019   CIN III (cervical intraepithelial neoplasia III) 03/06/13 03/06/2013    Allergies:  Allergies  Allergen Reactions   Pineapple Swelling   Amoxicillin Hives, Swelling and Rash   Penicillins Hives, Swelling and Rash   Mushroom  Extract Complex (Obsolete) Hives, Swelling and Rash    Pt allergic to MUSHROOMS.   Medications:  Current Outpatient Medications:    doxycycline  (VIBRA -TABS) 100 MG tablet, Take 1 tablet (100 mg total) by mouth 2 (two) times daily., Disp: 14 tablet, Rfl: 0   fluticasone (FLONASE) 50 MCG/ACT nasal spray, Place 2 sprays into both nostrils  daily., Disp: 16 g, Rfl: 0   amLODipine  (NORVASC ) 2.5 MG tablet, Take 2 tablets (5 mg total) by mouth 2 (two) times daily., Disp: 90 tablet, Rfl: 1   buPROPion  ER (WELLBUTRIN  SR) 100 MG 12 hr tablet, Take 1 tablet (100 mg total) by mouth 2 (two) times daily., Disp: 180 tablet, Rfl: 1   medroxyPROGESTERone  (DEPO-PROVERA ) 150 MG/ML injection, Inject 150 mg into the muscle every 3 (three) months., Disp: , Rfl:    Multiple Vitamins-Minerals (MULTIVITAMIN WITH MINERALS) tablet, Take 1 tablet by mouth daily., Disp: 100 tablet, Rfl: 0   propranolol  (INDERAL ) 10 MG tablet, Take 1 tablet (10 mg total) by mouth 2 (two) times daily as needed (Anxiety, headache prevention)., Disp: 60 tablet, Rfl: 5  Current Facility-Administered Medications:    medroxyPROGESTERone  (DEPO-PROVERA ) injection 150 mg, 150 mg, Intramuscular, Q90 days, , 150 mg at 02/27/24 1725  Observations/Objective: Patient is well-developed, well-nourished in no acute distress.  Resting comfortably at home.  Head is normocephalic, atraumatic.  No labored breathing.  Speech is clear and coherent with logical content.  Patient is alert and oriented at baseline.    Assessment and Plan: 1. Acute bacterial sinusitis (Primary) - doxycycline  (VIBRA -TABS) 100 MG tablet; Take 1 tablet (100 mg total) by mouth 2 (two) times daily.  Dispense: 14 tablet; Refill: 0 - fluticasone (FLONASE) 50 MCG/ACT nasal spray; Place 2 sprays into both nostrils daily.  Dispense: 16 g; Refill: 0  - Worsening symptoms that have not responded to OTC medications.  - Will give Doxycycline  and Flonase - Continue allergy medications.  - Steam and humidifier can help - Stay well hydrated and get plenty of rest.  - Seek in person evaluation if no symptom improvement or if symptoms worsen   Follow Up Instructions: I discussed the assessment and treatment plan with the patient. The patient was provided an opportunity to ask questions and all were answered. The patient  agreed with the plan and demonstrated an understanding of the instructions.  A copy of instructions were sent to the patient via MyChart unless otherwise noted below.    The patient was advised to call back or seek an in-person evaluation if the symptoms worsen or if the condition fails to improve as anticipated.    Delon CHRISTELLA Dickinson, PA-C

## 2024-03-18 NOTE — Patient Instructions (Signed)
 Sherri [name], thank you for joining Delon CHRISTELLA Dickinson, PA-C for today's virtual visit.  While this provider is not your primary care provider (PCP), if your PCP is located in our provider database this encounter information will be shared with them immediately following your visit.   A [facility] MyChart account gives you access to today's visit and all your visits, tests, and labs performed at [facility]  click here if you don't have a [facility] MyChart account or go to [email or web address]  Consent: (Patient) Sherri [name] provided verbal consent for this virtual visit at the beginning of the encounter.  Current Medications:  Current Outpatient Medications:    doxycycline  (VIBRA -TABS) 100 MG tablet, Take 1 tablet (100 mg total) by mouth 2 (two) times daily., Disp: 14 tablet, Rfl: 0   fluticasone (FLONASE) 50 MCG/ACT nasal spray, Place 2 sprays into both nostrils daily., Disp: 16 g, Rfl: 0   amLODipine  (NORVASC ) 2.5 MG tablet, Take 2 tablets (5 mg total) by mouth 2 (two) times daily., Disp: 90 tablet, Rfl: 1   buPROPion  ER (WELLBUTRIN  SR) 100 MG 12 hr tablet, Take 1 tablet (100 mg total) by mouth 2 (two) times daily., Disp: 180 tablet, Rfl: 1   medroxyPROGESTERone  (DEPO-PROVERA ) 150 MG/ML injection, Inject 150 mg into the muscle every 3 (three) months., Disp: , Rfl:    Multiple Vitamins-Minerals (MULTIVITAMIN WITH MINERALS) tablet, Take 1 tablet by mouth daily., Disp: 100 tablet, Rfl: 0   propranolol  (INDERAL ) 10 MG tablet, Take 1 tablet (10 mg total) by mouth 2 (two) times daily as needed (Anxiety, headache prevention)., Disp: 60 tablet, Rfl: 5  Current Facility-Administered Medications:    medroxyPROGESTERone  (DEPO-PROVERA ) injection 150 mg, 150 mg, Intramuscular, Q90 days, , 150 mg at 02/27/24 1725   Medications ordered in this encounter:  Meds ordered this encounter  Medications   doxycycline  (VIBRA -TABS) 100 MG tablet    Sig: Take 1 tablet (100 mg  total) by mouth 2 (two) times daily.    Dispense:  14 tablet    Refill:  0    Supervising Provider:   LAMPTEY, PHILIP O [[id number]]   fluticasone (FLONASE) 50 MCG/ACT nasal spray    Sig: Place 2 sprays into both nostrils daily.    Dispense:  16 g    Refill:  0    Supervising Provider:   BLAISE ALEENE KIDD [[id number]]     *If you need refills on other medications prior to your next appointment, please contact your pharmacy*  Follow-Up: Call back or seek an in-person evaluation if the symptoms worsen or if the condition fails to improve as anticipated.  [facility] [phone]  Other Instructions Sinus Infection, Adult A sinus infection, also called sinusitis, is inflammation of your sinuses. Sinuses are hollow spaces in the bones around your face. Your sinuses are located: Around your eyes. In the middle of your forehead. Behind your nose. In your cheekbones. Mucus normally drains out of your sinuses. When your nasal tissues become inflamed or swollen, mucus can become trapped or blocked. This allows bacteria, viruses, and fungi to grow, which leads to infection. Most infections of the sinuses are caused by a virus. A sinus infection can develop quickly. It can last for up to 4 weeks (acute) or for more than 12 weeks (chronic). A sinus infection often develops after a cold. What are the causes? This condition is caused by anything that creates swelling in the sinuses or stops mucus from draining. This includes: Allergies. Asthma.  Infection from bacteria or viruses. Deformities or blockages in your nose or sinuses. Abnormal growths in the nose (nasal polyps). Pollutants, such as chemicals or irritants in the air. Infection from fungi. This is rare. What increases the risk? You are more likely to develop this condition if you: Have a weak body defense system (immune system). Do a lot of swimming or diving. Overuse nasal sprays. Smoke. What are the signs or  symptoms? The main symptoms of this condition are pain and a feeling of pressure around the affected sinuses. Other symptoms include: Stuffy nose or congestion that makes it difficult to breathe through your nose. Thick yellow or greenish drainage from your nose. Tenderness, swelling, and warmth over the affected sinuses. A cough that may get worse at night. Decreased sense of smell and taste. Extra mucus that collects in the throat or the back of the nose (postnasal drip) causing a sore throat or bad breath. Tiredness (fatigue). Fever. How is this diagnosed? This condition is diagnosed based on: Your symptoms. Your medical history. A physical exam. Tests to find out if your condition is acute or chronic. This may include: Checking your nose for nasal polyps. Viewing your sinuses using a device that has a light (endoscope). Testing for allergies or bacteria. Imaging tests, such as an MRI or CT scan. In rare cases, a bone biopsy may be done to rule out more serious types of fungal sinus disease. How is this treated? Treatment for a sinus infection depends on the cause and whether your condition is chronic or acute. If caused by a virus, your symptoms should go away on their own within 10 days. You may be given medicines to relieve symptoms. They include: Medicines that shrink swollen nasal passages (decongestants). A spray that eases inflammation of the nostrils (topical intranasal corticosteroids). Rinses that help get rid of thick mucus in your nose (nasal saline washes). Medicines that treat allergies (antihistamines). Over-the-counter pain relievers. If caused by bacteria, your health care provider may recommend waiting to see if your symptoms improve. Most bacterial infections will get better without antibiotic medicine. You may be given antibiotics if you have: A severe infection. A weak immune system. If caused by narrow nasal passages or nasal polyps, surgery may be  needed. Follow these instructions at home: Medicines Take, use, or apply over-the-counter and prescription medicines only as told by your health care provider. These may include nasal sprays. If you were prescribed an antibiotic medicine, take it as told by your health care provider. Do not stop taking the antibiotic even if you start to feel better. Hydrate and humidify  Drink enough fluid to keep your urine pale yellow. Staying hydrated will help to thin your mucus. Use a cool mist humidifier to keep the humidity level in your home above 50%. Inhale steam for 10-15 minutes, 3-4 times a day, or as told by your health care provider. You can do this in the bathroom while a hot shower is running. Limit your exposure to cool or dry air. Rest Rest as much as possible. Sleep with your head raised (elevated). Make sure you get enough sleep each night. General instructions  Apply a warm, moist washcloth to your face 3-4 times a day or as told by your health care provider. This will help with discomfort. Use nasal saline washes as often as told by your health care provider. Wash your hands often with soap and water to reduce your exposure to germs. If soap and water are not available, use  hand sanitizer. Do not smoke. Avoid being around people who are smoking (secondhand smoke). Keep all follow-up visits. This is important. Contact a health care provider if: You have a fever. Your symptoms get worse. Your symptoms do not improve within 10 days. Get help right away if: You have a severe headache. You have persistent vomiting. You have severe pain or swelling around your face or eyes. You have vision problems. You develop confusion. Your neck is stiff. You have trouble breathing. These symptoms may be an emergency. Get help right away. Call 911. Do not wait to see if the symptoms will go away. Do not drive yourself to the hospital. Summary A sinus infection is soreness and inflammation of  your sinuses. Sinuses are hollow spaces in the bones around your face. This condition is caused by nasal tissues that become inflamed or swollen. The swelling traps or blocks the flow of mucus. This allows bacteria, viruses, and fungi to grow, which leads to infection. If you were prescribed an antibiotic medicine, take it as told by your health care provider. Do not stop taking the antibiotic even if you start to feel better. Keep all follow-up visits. This is important. This information is not intended to replace advice given to you by your health care provider. Make sure you discuss any questions you have with your health care provider. Document Revised: 03/30/2021 Document Reviewed: 03/30/2021 Elsevier Patient Education  2024 Elsevier Inc.   If you have been instructed to have an in-person evaluation today at a local Urgent Care facility, please use the link below. It will take you to a list of all of our available [facility], including address, phone number and hours of operation. Please do not delay care.  [facility]  If you or a family member do not have a primary care provider, use the link below to schedule a visit and establish care. When you choose a [facility] primary care physician or advanced practice provider, you gain a long-term partner in health. Find a Primary Care Provider  Learn more about [facility] in-office and virtual care options:  - Get Care Now

## 2024-04-19 ENCOUNTER — Encounter: Payer: Self-pay | Admitting: Family

## 2024-04-19 DIAGNOSIS — R87618 Other abnormal cytological findings on specimens from cervix uteri: Secondary | ICD-10-CM

## 2024-04-19 NOTE — Telephone Encounter (Signed)
 ok to send referral to [facility] on [address].

## 2024-05-14 NOTE — Telephone Encounter (Signed)
 send to [facility] on [address] or [facility] [address], thx

## 2024-05-14 NOTE — Addendum Note (Signed)
 Addended by: NEYSA CLARIA SAILOR on: 05/14/2024 05:00 PM   Modules accepted: Orders

## 2024-05-24 ENCOUNTER — Ambulatory Visit

## 2024-05-28 ENCOUNTER — Ambulatory Visit: Admitting: Family

## 2024-05-28 ENCOUNTER — Encounter: Payer: Self-pay | Admitting: Family

## 2024-05-28 ENCOUNTER — Ambulatory Visit

## 2024-05-28 VITALS — BP 138/86 | Temp 98.4°F | Ht 65.0 in | Wt 207.4 lb

## 2024-05-28 DIAGNOSIS — J069 Acute upper respiratory infection, unspecified: Secondary | ICD-10-CM

## 2024-05-28 DIAGNOSIS — H65191 Other acute nonsuppurative otitis media, right ear: Secondary | ICD-10-CM | POA: Diagnosis not present

## 2024-05-28 DIAGNOSIS — R059 Cough, unspecified: Secondary | ICD-10-CM

## 2024-05-28 LAB — POCT INFLUENZA A/B
Influenza A, POC: NEGATIVE
Influenza B, POC: NEGATIVE

## 2024-05-28 LAB — POC COVID19 BINAXNOW: SARS Coronavirus 2 Ag: NEGATIVE

## 2024-05-28 MED ORDER — CEFDINIR 300 MG PO CAPS: 300.0000 mg | ORAL_CAPSULE | Freq: Two times a day (BID) | ORAL | 0 refills | Status: AC

## 2024-05-28 NOTE — Progress Notes (Signed)
 "  Patient ID: Sherri [name], female    DOB: May 12, 1990, 34 y.o.   MRN: [id number]  Chief Complaint  Patient presents with   Sinus Problem    Pt c/o runny nose, cough, fatigue, right ear, present since 1/17. Has tried mucinex, sever congestion, nasal spray and nyquil.   Discussed the use of AI scribe software for clinical note transcription with the patient, who gave verbal consent to proceed.  History of Present Illness Sherri [name] is a 34 year old female who presents with sinus congestion and cough.  Her symptoms began 3 days ago over the weekend with sinus congestion, frontal headache, ear pain that is worse on waking, and nasal congestion. She uses Flonase  nasal spray twice daily. She developed a cough this morning that she attributes to sinus drainage. She has throat discomfort from coughing but no sore throat or fever. She is taking Benadryl , Tylenol  extra strength two tablets three times daily, DayQuil, and Mucinex for relief. She avoids ibuprofen due to stomach ulcers. 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 She has had sinus infections in the past and recently completed doxycycline  for a prior infection, which she tolerated well. She denies fever and sore throat, and confirms ear pain, headache, and nasal congestion.  Assessment & Plan Acute upper respiratory infection Viral etiology suspected due to recent exposure and symptomatology. No fever or significant lung findings. Rapid flu and covid are negative. - Continue Flonase  twice daily. - Use DayQuil as needed for antihistamine effect. - Take Tylenol  three times daily for symptom relief. - Maintain adequate hydration at least 2.5L water daily. - Avoid Mucinex unless chest symptoms worsen. - Call back if sx are persisting into next week.  Right otitis media Ear pain and pressure with dark TM and erythema. Amoxicillin contraindicated, cefdinir  selected  as alternative. - Prescribed cefdinir  300 mg every 12 hours for 7 days. - Provided work note for absence until Friday. - Call back if sx are persisting after finishing abt.  Subjective:    Outpatient Medications Prior to Visit  Medication Sig Dispense Refill   amLODipine  (NORVASC ) 2.5 MG tablet Take 2 tablets (5 mg total) by mouth 2 (two) times daily. 90 tablet 1   buPROPion  ER (WELLBUTRIN  SR) 100 MG 12 hr tablet Take 1 tablet (100 mg total) by mouth 2 (two) times daily. 180 tablet 1   doxycycline  (VIBRA -TABS) 100 MG tablet Take 1 tablet (100 mg total) by mouth 2 (two) times daily. 14 tablet 0   fluticasone  (FLONASE ) 50 MCG/ACT nasal spray Place 2 sprays into both nostrils daily. 16 g 0   medroxyPROGESTERone  (DEPO-PROVERA ) 150 MG/ML injection Inject 150 mg into the muscle every 3 (three) months.     Multiple Vitamins-Minerals (MULTIVITAMIN WITH MINERALS) tablet Take 1 tablet by mouth daily. 100 tablet 0   propranolol  (INDERAL ) 10 MG tablet Take 1 tablet (10 mg total) by mouth 2 (two) times daily as needed (Anxiety, headache prevention). 60 tablet 5   Facility-Administered Medications Prior to Visit  Medication Dose Route Frequency Provider Last Rate Last Admin   medroxyPROGESTERone  (DEPO-PROVERA ) injection 150 mg  150 mg Intramuscular Q90 days    150 mg at 02/27/24 1725   Past Medical History:  Diagnosis Date   ADHD    Anemia    Anxiety    Depression    Essential hypertension 10/28/2021   GERD (gastroesophageal reflux disease)    diet controlled  Pelvic pain in female 07/23/2020   Last Assessment & Plan:  Formatting of this note might be different from the original. -- Dyspareunia since 2015 following a LEEP procedure.  -- Describes pain as deep insertional pain. No pain ellicited with oral intercourse. No pain with BMs.  -- On exam, muscle tightness noted on left side of vagina in area of levator ani muscles. Strong sense of discomfort noted with posterior blade of speculu   Stomach  ulcer    Vaginal Pap smear, abnormal    2014   Past Surgical History:  Procedure Laterality Date   ELBOW SURGERY Left    LEEP     OPEN REDUCTION INTERNAL FIXATION (ORIF) FINGER WITH RADIAL BONE GRAFT Left 02/04/2022   Procedure: CLOSED TREATMENT OF LEFT RING FINGER FRACTURE;  Surgeon: Sebastian Lenis, MD;  Location: [facility] OR;  Service: Orthopedics;  Laterality: Left;  LENGTH OF SURGERY: 45 MINUTES   TONSILLECTOMY     Allergies[1]    Objective:    Physical Exam Vitals and nursing note reviewed.  Constitutional:      Appearance: Normal appearance. She is ill-appearing.     Interventions: Face mask in place.  HENT:     Right Ear: Ear canal normal. Tenderness present. Tympanic membrane is erythematous. Tympanic membrane is not bulging.     Left Ear: Tympanic membrane and ear canal normal.     Nose:     Right Sinus: Frontal sinus tenderness present.     Left Sinus: Frontal sinus tenderness present.     Mouth/Throat:     Mouth: Mucous membranes are moist.     Pharynx: Posterior oropharyngeal erythema present. No pharyngeal swelling, oropharyngeal exudate or uvula swelling.     Tonsils: No tonsillar exudate or tonsillar abscesses.  Cardiovascular:     Rate and Rhythm: Normal rate and regular rhythm.  Pulmonary:     Effort: Pulmonary effort is normal.     Breath sounds: Normal breath sounds.  Musculoskeletal:        General: Normal range of motion.  Lymphadenopathy:     Head:     Right side of head: No preauricular or posterior auricular adenopathy.     Left side of head: No preauricular or posterior auricular adenopathy.     Cervical: No cervical adenopathy.  Skin:    General: Skin is warm and dry.  Neurological:     Mental Status: She is alert.  Psychiatric:        Mood and Affect: Mood normal.        Behavior: Behavior normal.    BP 138/86 (BP Location: Left Arm, Patient Position: Sitting, Cuff Size: Normal)   Temp 98.4 F (36.9 C) (Temporal)   Ht 5' 5 (1.651 m)   Wt  207 lb 6.4 oz (94.1 kg)   BMI 34.51 kg/m  Wt Readings from Last 3 Encounters:  05/28/24 207 lb 6.4 oz (94.1 kg)  02/27/24 205 lb 8 oz (93.2 kg)  01/09/24 206 lb 3.2 oz (93.5 kg)      Shaquon Gropp, NP     [1]  Allergies Allergen Reactions   Pineapple Swelling   Amoxicillin Hives, Swelling and Rash   Penicillins Hives, Swelling and Rash   Mushroom Extract Complex (Obsolete) Hives, Swelling and Rash    Pt allergic to MUSHROOMS.   "

## 2024-06-03 ENCOUNTER — Ambulatory Visit

## 2024-06-04 ENCOUNTER — Ambulatory Visit

## 2024-06-04 VITALS — BP 135/80 | Ht 65.0 in | Wt 208.0 lb

## 2024-06-04 DIAGNOSIS — Z30013 Encounter for initial prescription of injectable contraceptive: Secondary | ICD-10-CM

## 2024-06-04 DIAGNOSIS — Z308 Encounter for other contraceptive management: Secondary | ICD-10-CM

## 2024-06-04 DIAGNOSIS — Z3009 Encounter for other general counseling and advice on contraception: Secondary | ICD-10-CM

## 2024-06-04 DIAGNOSIS — Z3042 Encounter for surveillance of injectable contraceptive: Secondary | ICD-10-CM

## 2024-06-04 NOTE — Telephone Encounter (Signed)
 needs OV to recheck ear

## 2024-06-04 NOTE — Telephone Encounter (Signed)
 Spoke with patient. She will call back to make an OV for continuous  ear pain.

## 2024-06-04 NOTE — Telephone Encounter (Signed)
 Please review and advise.

## 2024-06-04 NOTE — Progress Notes (Signed)
 14 Weeks   0 Days since last Depo    Voices no concerns today.  Counseled to adhere to 11 to 13 week intervals between depo injections for optimal benefit.  Depo given today per order by VEAR Bers, FNP  dated 11/07/2023.  Tolerated well LUOQ.  Next depo due 08/20/2024,  has reminder card. Sherri Devivo, RN

## 2024-06-05 ENCOUNTER — Ambulatory Visit: Admitting: Family

## 2024-06-06 IMAGING — DX DG CHEST 1V PORT
1 series · 1 of 1 positions shown · non-contrast
Comparison: March 26, 2019.

CLINICAL DATA: Chest pain.

EXAM:
PORTABLE CHEST 1 VIEW

[chest ap]
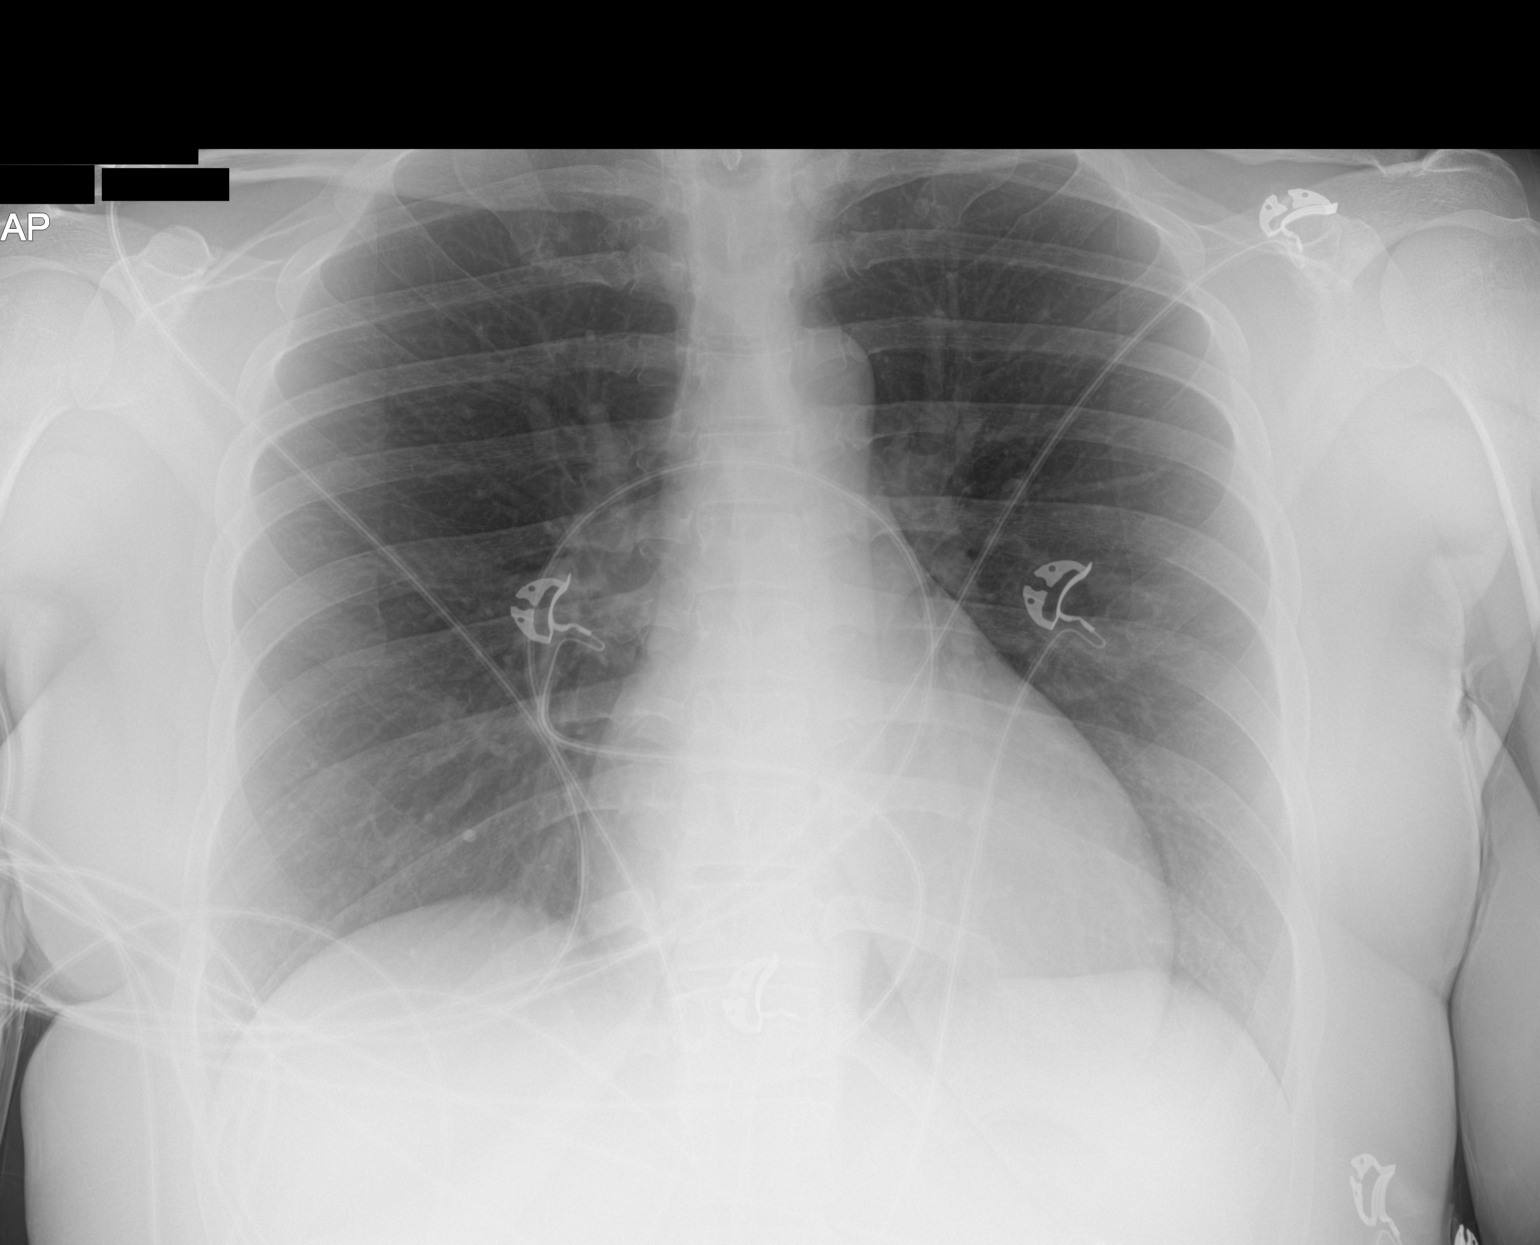

[1 of 1 positions shown; findings below may reference images not displayed]

FINDINGS: No consolidation. No visible pleural effusions or pneumothorax.
Cardiomediastinal silhouette is consider within normal limits when
accounting for AP technique. No displaced fracture.
IMPRESSION: No evidence of acute cardiopulmonary disease.

## 2024-06-07 ENCOUNTER — Ambulatory Visit: Admitting: Family

## 2024-06-07 ENCOUNTER — Encounter: Payer: Self-pay | Admitting: Family

## 2024-06-07 VITALS — BP 122/74 | HR 68 | Temp 99.7°F | Ht 65.0 in | Wt 211.6 lb

## 2024-06-07 DIAGNOSIS — R509 Fever, unspecified: Secondary | ICD-10-CM

## 2024-06-07 LAB — POCT INFLUENZA A/B
Influenza A, POC: NEGATIVE
Influenza B, POC: NEGATIVE

## 2024-06-07 LAB — POC COVID19 BINAXNOW: SARS Coronavirus 2 Ag: NEGATIVE

## 2024-06-07 NOTE — Progress Notes (Signed)
 "  Patient ID: Sherri [name], female    DOB: [birth year], 34 y.o.   MRN: [id number]  Chief Complaint  Patient presents with   Ear Pain    Was on antibiotics last week for 5 days for URI. Finished meds on Saturday but still having congestion. Right ear popping started Tuesday. No NV but does have diarrhea and walked in with a fever of 101.7.  Discussed the use of AI scribe software for clinical note transcription with the patient, who gave verbal consent to proceed.  History of Present Illness Sherri [name] is a 34 year old female who presents with ear pain and recent history of fever and diarrhea.  She reports recently being treated for a right ear infection and she is now having ear pain with a popping sensation, worse with yawning or burping, and a feeling of water in the ear. She has no tenderness around the ear. She had diarrhea while taking the antibiotic, which has improved after she stopped it. She has runny nose and phlegm but no sore throat, cough, headache, or other head pain.  Assessment & Plan Fever of unknown origin New fever noted on VS check, pt without sx other than ear pain and with negative COVID and flu tests today. Possible viral etiology considered. - Encouraged fluid intake. - Advised Tylenol  or ibuprofen prn for fever management.  Right otitis media, resolving Right ear shows improvement with no bulging or erythema. Pain likely due to popping sensation as Eustachian tube opens, advised this is normal as well as muffled sensation in ear can take another 2-3 weeks to resolve.  - Advised Tylenol  or ibuprofen 2-3x/day for ear pain management.   Subjective:    Outpatient Medications Prior to Visit  Medication Sig Dispense Refill   amLODipine  (NORVASC ) 2.5 MG tablet Take 2 tablets (5 mg total) by mouth 2 (two) times daily. 90 tablet 1   buPROPion  ER (WELLBUTRIN  SR) 100 MG 12 hr tablet Take 1 tablet (100 mg total) by mouth 2 (two) times daily. 180 tablet 1    medroxyPROGESTERone  (DEPO-PROVERA ) 150 MG/ML injection Inject 150 mg into the muscle every 3 (three) months.     Multiple Vitamins-Minerals (MULTIVITAMIN WITH MINERALS) tablet Take 1 tablet by mouth daily. 100 tablet 0   propranolol  (INDERAL ) 10 MG tablet Take 1 tablet (10 mg total) by mouth 2 (two) times daily as needed (Anxiety, headache prevention). 60 tablet 5   fluticasone  (FLONASE ) 50 MCG/ACT nasal spray Place 2 sprays into both nostrils daily. (Patient not taking: Reported on 06/04/2024) 16 g 0   Facility-Administered Medications Prior to Visit  Medication Dose Route Frequency Provider Last Rate Last Admin   medroxyPROGESTERone  (DEPO-PROVERA ) injection 150 mg  150 mg Intramuscular Q90 days    150 mg at 06/04/24 1524   Past Medical History:  Diagnosis Date   ADHD    Anemia    Anxiety    Depression    Essential hypertension 10/28/2021   GERD (gastroesophageal reflux disease)    diet controlled   Pelvic pain in female 07/23/2020   Last Assessment & Plan:  Formatting of this note might be different from the original. -- Dyspareunia since 2015 following a LEEP procedure.  -- Describes pain as deep insertional pain. No pain ellicited with oral intercourse. No pain with BMs.  -- On exam, muscle tightness noted on left side of vagina in area of levator ani muscles. Strong sense of discomfort noted with posterior blade of speculu   Stomach ulcer  Vaginal Pap smear, abnormal    2014   Past Surgical History:  Procedure Laterality Date   ELBOW SURGERY Left    LEEP     OPEN REDUCTION INTERNAL FIXATION (ORIF) FINGER WITH RADIAL BONE GRAFT Left 02/04/2022   Procedure: CLOSED TREATMENT OF LEFT RING FINGER FRACTURE;  Surgeon: Sebastian Lenis, MD;  Location: [facility] OR;  Service: Orthopedics;  Laterality: Left;  LENGTH OF SURGERY: 45 MINUTES   TONSILLECTOMY     Allergies[1]    Objective:    Physical Exam Vitals and nursing note reviewed.  Constitutional:      Appearance: Normal appearance.   HENT:     Right Ear: Tympanic membrane and ear canal normal.     Left Ear: Tympanic membrane and ear canal normal.  Cardiovascular:     Rate and Rhythm: Normal rate and regular rhythm.  Pulmonary:     Effort: Pulmonary effort is normal.     Breath sounds: Normal breath sounds.  Musculoskeletal:        General: Normal range of motion.  Lymphadenopathy:     Head:     Right side of head: No submandibular, preauricular or posterior auricular adenopathy.     Left side of head: No submandibular, preauricular or posterior auricular adenopathy.     Cervical: No cervical adenopathy.     Right cervical: No superficial cervical adenopathy.    Left cervical: No superficial cervical adenopathy.  Skin:    General: Skin is warm and dry.  Neurological:     Mental Status: She is alert.  Psychiatric:        Mood and Affect: Mood normal.        Behavior: Behavior normal.    BP 122/74 (BP Location: Right Arm, Patient Position: Sitting)   Pulse 68   Temp (!) 101.7 F (38.7 C) (Temporal)   Ht 5' 5 (1.651 m)   Wt 211 lb 9.6 oz (96 kg)   SpO2 98%   BMI 35.21 kg/m  Wt Readings from Last 3 Encounters:  06/07/24 211 lb 9.6 oz (96 kg)  06/04/24 208 lb (94.3 kg)  05/28/24 207 lb 6.4 oz (94.1 kg)      Allan Bacigalupi, NP     [1]  Allergies Allergen Reactions   Pineapple Swelling   Amoxicillin Hives, Swelling and Rash   Penicillins Hives, Swelling and Rash   Mushroom Extract Complex (Obsolete) Hives, Swelling and Rash    Pt allergic to MUSHROOMS.   "

## 2024-07-12 ENCOUNTER — Encounter: Admitting: Family

## 2024-07-16 ENCOUNTER — Encounter: Admitting: Obstetrics
